# Patient Record
Sex: Female | Born: 1995 | Race: White | Hispanic: No | Marital: Married | Smoking: Never smoker
Health system: Southern US, Community
[De-identification: ages and names within clinical notes are randomized; demographics above are authoritative.]

## PROBLEM LIST (undated history)

## (undated) DIAGNOSIS — N390 Urinary tract infection, site not specified: Secondary | ICD-10-CM

## (undated) DIAGNOSIS — Z309 Encounter for contraceptive management, unspecified: Secondary | ICD-10-CM

## (undated) DIAGNOSIS — Z8719 Personal history of other diseases of the digestive system: Secondary | ICD-10-CM

## (undated) DIAGNOSIS — L709 Acne, unspecified: Secondary | ICD-10-CM

## (undated) DIAGNOSIS — G43909 Migraine, unspecified, not intractable, without status migrainosus: Secondary | ICD-10-CM

## (undated) HISTORY — DX: Encounter for contraceptive management, unspecified: Z30.9

## (undated) HISTORY — PX: WISDOM TOOTH EXTRACTION: SHX21

## (undated) HISTORY — DX: Personal history of other diseases of the digestive system: Z87.19

## (undated) HISTORY — DX: Acne, unspecified: L70.9

## (undated) HISTORY — PX: COLONOSCOPY: SHX174

---
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
## 2016-01-26 IMAGING — CR DG NECK SOFT TISSUE
2 series · 2 of 2 positions shown · non-contrast
Comparison: None.

CLINICAL DATA: Sore throat for 2 months. Three rounds of
antibiotics have not significantly helped.

EXAM:
NECK SOFT TISSUES - 1+ VIEW

[view not recorded (1 of 2)]
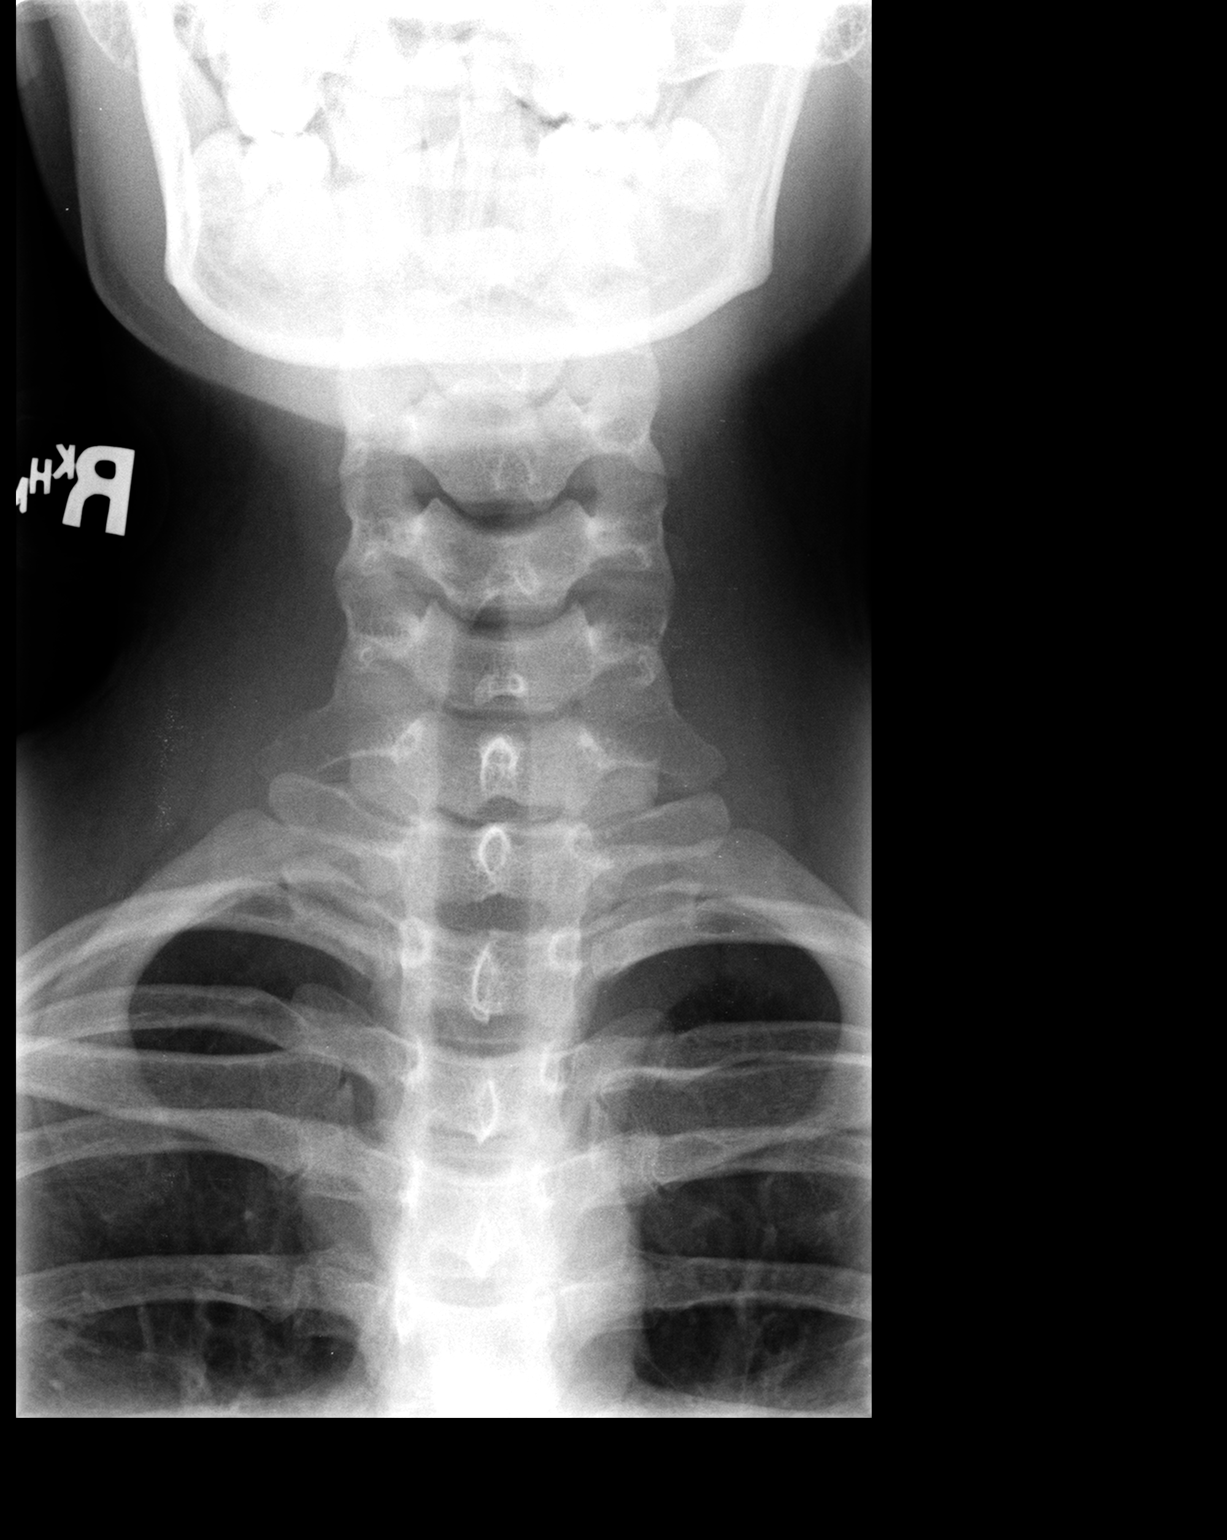

[view not recorded (2 of 2)]
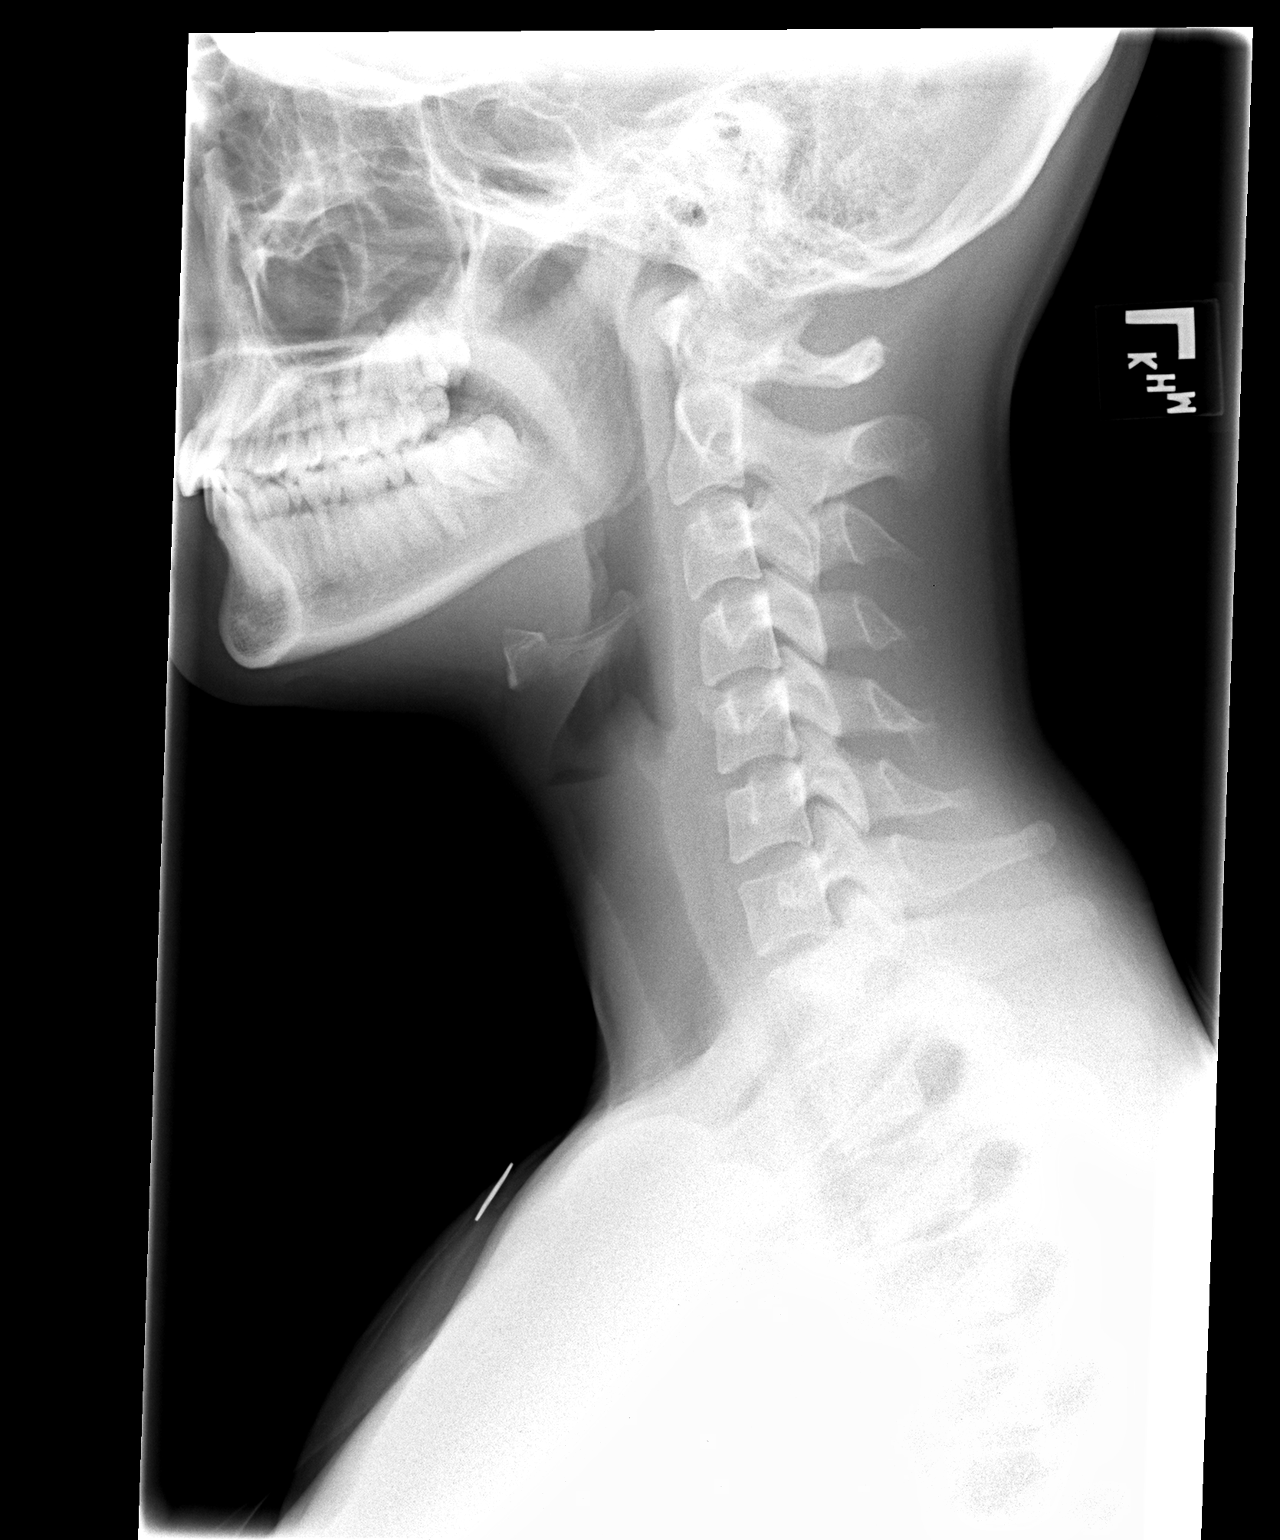

[2 of 2 positions shown; findings below may reference images not displayed]

FINDINGS: There is no evidence of retropharyngeal soft tissue swelling or
epiglottic enlargement. The cervical airway is unremarkable and no
radio-opaque foreign body identified.
IMPRESSION: Negative.

## 2016-03-02 ENCOUNTER — Telehealth: Payer: Self-pay | Admitting: *Deleted

## 2016-03-02 MED ORDER — NORETHIN-ETH ESTRAD-FE BIPHAS 1 MG-10 MCG / 10 MCG PO TABS: 1.0000 | ORAL_TABLET | Freq: Every day | ORAL | Status: DC

## 2016-03-02 NOTE — Telephone Encounter (Signed)
Pt Mother, Arline AspCindy, requesting samples of Loloestrin Fe. Loloestrin Fe (1 box) left at front desk for pick. Pt does not have insurance at this time and that is why they are requesting samples.

## 2016-03-31 ENCOUNTER — Telehealth: Payer: Self-pay | Admitting: Advanced Practice Midwife

## 2016-03-31 MED ORDER — NORETHIN-ETH ESTRAD-FE BIPHAS 1 MG-10 MCG / 10 MCG PO TABS: 1.0000 | ORAL_TABLET | Freq: Every day | ORAL | Status: DC

## 2016-03-31 NOTE — Telephone Encounter (Signed)
Pt Mother, Arline AspCindy, states pt insurance will not cover Lo loestrin FE, cost $80.00. Is the an alternative that would be compatible and cheaper?   1 box of Lo loestrin samples left at front desk, pt is completely out.

## 2016-04-03 ENCOUNTER — Telehealth: Payer: Self-pay | Admitting: Adult Health

## 2016-04-03 MED ORDER — NORETHINDRONE ACET-ETHINYL EST 1-20 MG-MCG PO TABS: 1.0000 | ORAL_TABLET | Freq: Every day | ORAL | Status: DC

## 2016-04-03 NOTE — Telephone Encounter (Signed)
Will change to junel 1-20 and needs appt

## 2016-04-03 NOTE — Telephone Encounter (Signed)
Spoke with pt and pt's mom. Pt is on Lo Loestrin and feels like it's causing acne. Pt is interested in switching to something that will help with acne. Pt's mom mentioned generic Yaz. Please advise. Thanks!! JSY

## 2016-04-06 ENCOUNTER — Telehealth: Payer: Self-pay | Admitting: *Deleted

## 2016-04-06 NOTE — Telephone Encounter (Signed)
Has acne,sees dermatologist in am, keep taking OCs for 3 months.

## 2016-04-06 NOTE — Telephone Encounter (Signed)
Pt Mother, Arline AspCindy, states pt started taking the Microgestin last night, has several questions. Will pt have a period with Microgestin, did not have period with Loloestrin? Is Microgestin good for acne?

## 2016-07-08 ENCOUNTER — Encounter: Payer: Self-pay | Admitting: Advanced Practice Midwife

## 2016-07-08 ENCOUNTER — Ambulatory Visit (INDEPENDENT_AMBULATORY_CARE_PROVIDER_SITE_OTHER): Payer: BLUE CROSS/BLUE SHIELD | Admitting: Advanced Practice Midwife

## 2016-07-08 VITALS — BP 100/60 | HR 78 | Temp 98.2°F | Ht 60.0 in | Wt 102.0 lb

## 2016-07-08 DIAGNOSIS — R309 Painful micturition, unspecified: Secondary | ICD-10-CM | POA: Diagnosis not present

## 2016-07-08 DIAGNOSIS — Z308 Encounter for other contraceptive management: Secondary | ICD-10-CM | POA: Diagnosis not present

## 2016-07-08 DIAGNOSIS — Z3009 Encounter for other general counseling and advice on contraception: Secondary | ICD-10-CM

## 2016-07-08 LAB — POCT URINALYSIS DIPSTICK
Blood, UA: NEGATIVE
Glucose, UA: NEGATIVE
Ketones, UA: NEGATIVE
LEUKOCYTES UA: NEGATIVE
Nitrite, UA: NEGATIVE

## 2016-07-08 MED ORDER — PHENAZOPYRIDINE HCL 200 MG PO TABS: 200.0000 mg | ORAL_TABLET | Freq: Three times a day (TID) | ORAL | 0 refills | Status: DC | PRN

## 2016-07-08 NOTE — Progress Notes (Signed)
[facility] Visit  Patient name: SHAKEIA KRUS MRN [id number]  Date of birth: 1996-10-16  CC & HPI:  Anna Kent is a 20 y.o. Caucasian female presenting today for UTI Symptoms started 6 days ago (urgency, dysuria).  Started Bactrim/Azo (was rx'd it for acne). And took for a few days.  Went to [facility] Urgent care yesterday.  Says urine dip showed "horrible infection" and was given Macrobid.  Culture is pending.  Symptoms not improved.    Also wants to discuss Nexplanon.  Is currently on LoLoestrin;  Got acne after starting it.  Plans Accutane.  Discussed that Nexplanon is unlikely to help with acne, could even make it worse, but hopefully accutane will negate that.  Discussed risks/benefits/SE.     Pertinent History Reviewed:  Medical & Surgical Hx:   Past Medical History:  Diagnosis Date  . Acne 09/06/2014  . Contraceptive management 08/07/2015  . Migraines   . UTI (lower urinary tract infection)    Past Surgical History:  Procedure Laterality Date  . WISDOM TOOTH EXTRACTION     Family History  Problem Relation Age of Onset  . Cancer Mother     breast  . COPD Paternal Grandmother     Current Outpatient Prescriptions:  .  ibuprofen (ADVIL,MOTRIN) 200 MG tablet, Take 400 mg by mouth as needed. For pain, Disp: , Rfl:  .  norethindrone-ethinyl estradiol (MICROGESTIN,JUNEL,LOESTRIN) 1-20 MG-MCG tablet, Take 1 tablet by mouth daily., Disp: 1 Package, Rfl: 3 .  albuterol (PROVENTIL, VENTOLIN) (5 MG/ML) 0.5% NEBU, Take 5 mg/hr by nebulization as needed. , Disp: , Rfl:  .  Biotin (BIOTIN 5000) 5 MG CAPS, Take by mouth as needed. , Disp: , Rfl:  .  cetirizine (ZYRTEC) 10 MG tablet, Take 10 mg by mouth as needed. , Disp: , Rfl:  .  phenazopyridine (PYRIDIUM) 200 MG tablet, Take 1 tablet (200 mg total) by mouth 3 (three) times daily as needed for pain., Disp: 10 tablet, Rfl: 0 Social History: Reviewed -  reports that she has never smoked. She has never used  smokeless tobacco.  Review of Systems:   Constitutional: Negative for FEVER and chills Eyes: Negative for visual disturbances Respiratory: Negative for shortness of breath, dyspnea Cardiovascular: Negative for chest pain or palpitations  Gastrointestinal: Negative for vomiting, diarrhea and constipation; Genitourinary: Negative vaginal irritation or itching Musculoskeletal: Negative for joint pain, myalgias  Neurological: Negative for dizziness and headaches    Objective Findings:    Physical Examination: General appearance - well appearing, and in no distress Mental status - alert, oriented to person, place, and time Chest:  Normal respiratory effort Heart - normal rate and regular rhythm Abdomen:  Soft, tender in suprapubic area.  Has LBP, but no CVAT.   Musculoskeletal:  Normal range of motion without pain Extremities:  No edema    Results for orders placed or performed in visit on 07/08/16 (from the past 24 hour(s))  POCT urinalysis dipstick   Collection Time: 07/08/16  4:34 PM  Result Value Ref Range   Color, UA     Clarity, UA     Glucose, UA neg    Bilirubin, UA     Ketones, UA neg    Spec Grav, UA     Blood, UA neg    pH, UA     Protein, UA trace    Urobilinogen, UA     Nitrite, UA neg    Leukocytes, UA Negative Negative  Assessment & Plan:  A:   UTI:  Has only been on ABX for 1 day!  Will F/U with [facility] urgent care when culture comes through.  Rx Pyridium for pain   Contraception manaagement P:     Return in about 3 weeks (around 07/29/2016) for Nexplanon (order).  CRESENZO-DISHMAN,[place] CNM 07/09/2016 8:56 AM

## 2016-07-10 ENCOUNTER — Ambulatory Visit: Payer: No Typology Code available for payment source | Admitting: Adult Health

## 2016-07-14 ENCOUNTER — Ambulatory Visit: Payer: No Typology Code available for payment source | Admitting: Advanced Practice Midwife

## 2016-07-28 ENCOUNTER — Encounter: Payer: Self-pay | Admitting: Women's Health

## 2016-07-28 ENCOUNTER — Ambulatory Visit (INDEPENDENT_AMBULATORY_CARE_PROVIDER_SITE_OTHER): Payer: BLUE CROSS/BLUE SHIELD | Admitting: Women's Health

## 2016-07-28 VITALS — BP 104/68

## 2016-07-28 DIAGNOSIS — Z3049 Encounter for surveillance of other contraceptives: Secondary | ICD-10-CM | POA: Diagnosis not present

## 2016-07-28 DIAGNOSIS — Z3202 Encounter for pregnancy test, result negative: Secondary | ICD-10-CM | POA: Diagnosis not present

## 2016-07-28 DIAGNOSIS — Z308 Encounter for other contraceptive management: Secondary | ICD-10-CM

## 2016-07-28 DIAGNOSIS — Z30017 Encounter for initial prescription of implantable subdermal contraceptive: Secondary | ICD-10-CM | POA: Insufficient documentation

## 2016-07-28 LAB — POCT URINE PREGNANCY: Preg Test, Ur: NEGATIVE

## 2016-07-28 NOTE — Progress Notes (Signed)
[name] is a 20 y.o. year old Caucasian female here for Nexplanon insertion.  Patient's last menstrual period was 07/20/2016., last sexual intercourse was ~2wks ago, had period last week and no sex since, and her pregnancy test today was negative.  Risks/benefits/side effects of Nexplanon have been discussed and her questions have been answered.  Specifically, a failure rate of 10/998 has been reported, with an increased failure rate if pt takes St. John's Wort and/or antiseizure medicaitons.  Randon GoldsmithRebecca L Kent is aware of the common side effect of irregular bleeding, which the incidence of decreases over time.  BP 104/68 (BP Location: Right Arm, Patient Position: Sitting, Cuff Size: Normal)   LMP 07/20/2016   Results for orders placed or performed in visit on 07/28/16 (from the past 24 hour(s))  POCT urine pregnancy   Collection Time: 07/28/16 10:37 AM  Result Value Ref Range   Preg Test, Ur Negative Negative     She is right-handed, so her left arm, approximately 4 inches proximal from the elbow, was cleansed with alcohol and anesthetized with 2cc of 2% Lidocaine.  The area was cleansed again with betadine and the Nexplanon was inserted per manufacturer's recommendations without difficulty.  A steri-strip and pressure bandage were applied.  Pt was instructed to keep the area clean and dry, remove pressure bandage in 24 hours, and keep insertion site covered with the steri-strip for 3-5 days.  Back up contraception was recommended for 2 weeks.  She was given a card indicating date Nexplanon was inserted and date it needs to be removed. Follow-up PRN problems. Condoms always for STI prevention. Pap @ 20yo.   Marge DuncansBooker, Kimberly Randall CNM, [facility]NP-BC 07/28/2016 11:10 AM

## 2016-07-28 NOTE — Patient Instructions (Signed)

## 2016-12-11 ENCOUNTER — Telehealth: Payer: Self-pay | Admitting: Adult Health

## 2016-12-11 NOTE — Telephone Encounter (Signed)
Spoke with pt's mom. Pt is having pain with urination and also pain when she's not urinating. Crying with pain. PCP can't see her today. Pt needs to be seen and we don't have any available appts. I advised to go to Urgent Care or ER for eval. Pt's mom voiced understanding. JSY

## 2017-09-24 ENCOUNTER — Ambulatory Visit: Payer: BLUE CROSS/BLUE SHIELD | Admitting: Women's Health

## 2017-12-20 ENCOUNTER — Telehealth: Payer: Self-pay | Admitting: *Deleted

## 2017-12-20 NOTE — Telephone Encounter (Signed)
Patient's mother states they want to have the Nexplanon removed since the patient has gained weight and is having some issues with her labs. She was informed by her PCP that the Nexplanon was the reason for her elevated labs. Informed patient that per Selena BattenKim, the patient had elevated platelets 8 years ago before Nexplanon was inserted. Mother still adamant patient have it removed. Will get scheduled. Also wants to discuss other options.

## 2017-12-21 ENCOUNTER — Encounter: Payer: Self-pay | Admitting: Adult Health

## 2017-12-21 ENCOUNTER — Ambulatory Visit: Payer: BLUE CROSS/BLUE SHIELD | Admitting: Adult Health

## 2017-12-21 VITALS — BP 100/60 | HR 97 | Ht 61.0 in | Wt 124.0 lb

## 2017-12-21 DIAGNOSIS — Z3046 Encounter for surveillance of implantable subdermal contraceptive: Secondary | ICD-10-CM

## 2017-12-21 DIAGNOSIS — Z3049 Encounter for surveillance of other contraceptives: Secondary | ICD-10-CM

## 2017-12-21 NOTE — Progress Notes (Signed)
Subjective:     Patient ID: Anna Kent, female   DOB: 1996/07/31, 21 y.o.   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   Review of Systems For nexplanon removal Reviewed past medical,surgical, social and family history. Reviewed medications and allergies.     Objective:   Physical Exam BP 100/60 (BP Location: Left Arm, Patient Position: Sitting, Cuff Size: Small)   Pulse 97   Ht 5\' 1"  (1.549 m)   Wt 124 lb (56.2 kg)   LMP 11/27/2017   BMI 23.43 kg/m  Consent signed, time out called. Left arm cleansed with betadine, and injected with 1.5 cc 1% lidocaine and waited til numb.Under sterile technique a #11 blade was used to make small vertical incision, and a curved forceps was used to easily remove rod. Steri strips applied. Pressure dressing applied.    Assessment:     1. Encounter for Nexplanon removal       Plan:    Review handouts on para guard and liletta  Use condoms, keep clean and dry x 24 hours, no heavy lifting, keep steri strips on x 72 hours, Keep pressure dressing on x 24 hours. Follow up prn problems. NO sex for 2 weeks prior to IUD, needs Cytotec before and stat QHCG in am, when makes that appt. Check insurance for IUDs

## 2017-12-21 NOTE — Patient Instructions (Addendum)
Use condoms, keep clean and dry x 24 hours, no heavy lifting, keep steri strips on x 72 hours, Keep pressure dressing on x 24 hours. Follow up prn problems. NO sex for 2 weeks prior to IUD, [facility] Check insurance lilett

## 2017-12-24 ENCOUNTER — Telehealth: Payer: Self-pay | Admitting: Obstetrics & Gynecology

## 2017-12-24 NOTE — Telephone Encounter (Signed)
Pts mother called wanting a prescription for birth control pills instead of IUD. Informed her that she would have to have an appt for that as we do pregnancy tests before giving prescriptions. She verbalized understanding.

## 2017-12-30 ENCOUNTER — Other Ambulatory Visit: Payer: Self-pay | Admitting: Women's Health

## 2018-01-03 ENCOUNTER — Other Ambulatory Visit: Payer: Self-pay | Admitting: Women's Health

## 2018-01-05 ENCOUNTER — Ambulatory Visit (INDEPENDENT_AMBULATORY_CARE_PROVIDER_SITE_OTHER): Payer: BLUE CROSS/BLUE SHIELD | Admitting: Advanced Practice Midwife

## 2018-01-05 ENCOUNTER — Encounter: Payer: Self-pay | Admitting: Advanced Practice Midwife

## 2018-01-05 VITALS — BP 100/60 | HR 75 | Ht 61.0 in | Wt 125.5 lb

## 2018-01-05 DIAGNOSIS — Z3202 Encounter for pregnancy test, result negative: Secondary | ICD-10-CM | POA: Diagnosis not present

## 2018-01-05 DIAGNOSIS — Z01419 Encounter for gynecological examination (general) (routine) without abnormal findings: Secondary | ICD-10-CM | POA: Diagnosis not present

## 2018-01-05 DIAGNOSIS — Z0000 Encounter for general adult medical examination without abnormal findings: Secondary | ICD-10-CM

## 2018-01-05 DIAGNOSIS — Z113 Encounter for screening for infections with a predominantly sexual mode of transmission: Secondary | ICD-10-CM

## 2018-01-05 DIAGNOSIS — Z30011 Encounter for initial prescription of contraceptive pills: Secondary | ICD-10-CM

## 2018-01-05 LAB — POCT URINE PREGNANCY: Preg Test, Ur: NEGATIVE

## 2018-01-05 MED ORDER — NORGESTIMATE-ETH ESTRADIOL 0.25-35 MG-MCG PO TABS: 1.0000 | ORAL_TABLET | Freq: Every day | ORAL | 11 refills | Status: DC

## 2018-01-05 NOTE — Progress Notes (Addendum)
Anna Kent 22 y.o.  Vitals:   01/05/18 1428  BP: 100/60  Pulse: 75     Filed Weights   01/05/18 1428  Weight: 125 lb 8 oz (56.9 kg)    Past Medical History: Past Medical History:  Diagnosis Date  . Acne 09/06/2014  . Contraceptive management 08/07/2015  . Migraines   . UTI (lower urinary tract infection)     Past Surgical History: Past Surgical History:  Procedure Laterality Date  . WISDOM TOOTH EXTRACTION      Family History: Family History  Problem Relation Age of Onset  . Cancer Mother        breast  . COPD Paternal Grandmother     Social History: Social History   Tobacco Use  . Smoking status: Never Smoker  . Smokeless tobacco: Never Used  Substance Use Topics  . Alcohol use: No    Alcohol/week: 0.0 oz  . Drug use: No    Allergies:  Allergies  Allergen Reactions  . Imitrex [Sumatriptan] Other (See Comments)    Chest pain      Current Outpatient Medications:  .  FLUoxetine (PROZAC) 10 MG capsule, Take 10 mg by mouth daily., Disp: , Rfl:  .  fluticasone (FLONASE) 50 MCG/ACT nasal spray, Place into both nostrils daily., Disp: , Rfl:  .  ibuprofen (ADVIL,MOTRIN) 200 MG tablet, Take 400 mg by mouth as needed. For pain, Disp: , Rfl:  .  loratadine (CLARITIN) 10 MG tablet, Take 10 mg by mouth daily., Disp: , Rfl:  .  norgestimate-ethinyl estradiol (ORTHO-CYCLEN,SPRINTEC,PREVIFEM) 0.25-35 MG-MCG tablet, Take 1 tablet by mouth daily., Disp: 1 Package, Rfl: 11  History of Present Illness: here for birth control pills.  She had nexplanon removed a few weeks ago(weight gain) and had planned on paragard but changed mind.  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    Review of Systems   Patient denies any headaches, blurred vision, shortness of breath, chest pain, abdominal pain, problems with bowel movements, urination, or intercourse.   Physical Exam: General:  Well developed, well  nourished, no acute distress Skin:  Warm and dry Neck:  Midline trachea, normal thyroid Lungs; Clear to auscultation bilaterally Breast:  No dominant palpable mass, retraction, or nipple discharge Cardiovascular: Regular rate and rhythm Abdomen:  Soft, non tender, no hepatosplenomegaly Pelvic:  Deferred Extremities:  No swelling or varicosities noted Psych:  No mood changes.     Impression: normal gyn exam' Contraception mgt     Plan: start sprintec now.  COntinue condoms for 2 weeks

## 2018-01-05 NOTE — Patient Instructions (Signed)
Oral Contraception Information Oral contraceptive pills (OCPs) are medicines taken to prevent pregnancy. OCPs work by preventing the ovaries from releasing eggs. The hormones in OCPs also cause the cervical mucus to thicken, preventing the sperm from entering the uterus. The hormones also cause the uterine lining to become thin, not allowing a fertilized egg to attach to the inside of the uterus. OCPs are highly effective when taken exactly as prescribed. However, OCPs do not prevent sexually transmitted diseases (STDs). Safe sex practices, such as using condoms along with the pill, can help prevent STDs. Before taking the pill, you may have a physical exam and Pap test. Your health care provider may order blood tests. The health care provider will make sure you are a good candidate for oral contraception. Discuss with your health care provider the possible side effects of the OCP you may be prescribed. When starting an OCP, it can take 2 to 3 months for the body to adjust to the changes in hormone levels in your body. Types of oral contraception  The combination pill-This pill contains estrogen and progestin (synthetic progesterone) hormones. The combination pill comes in 21-day, 28-day, or 91-day packs. Some types of combination pills are meant to be taken continuously (365-day pills). With 21-day packs, you do not take pills for 7 days after the last pill. With 28-day packs, the pill is taken every day. The last 7 pills are without hormones. Certain types of pills have more than 21 hormone-containing pills. With 91-day packs, the first 84 pills contain both hormones, and the last 7 pills contain no hormones or contain estrogen only.  The minipill-This pill contains the progesterone hormone only. The pill is taken every day continuously. It is very important to take the pill at the same time each day. The minipill comes in packs of 28 pills. All 28 pills contain the hormone. Advantages of oral  contraceptive pills  Decreases premenstrual symptoms.  Treats menstrual period cramps.  Regulates the menstrual cycle.  Decreases a heavy menstrual flow.  May treatacne, depending on the type of pill.  Treats abnormal uterine bleeding.  Treats polycystic ovarian syndrome.  Treats endometriosis.  Can be used as emergency contraception. Things that can make oral contraceptive pills less effective OCPs can be less effective if:  You forget to take the pill at the same time every day.  You have a stomach or intestinal disease that lessens the absorption of the pill.  You take OCPs with other medicines that make OCPs less effective, such as antibiotics, certain HIV medicines, and some seizure medicines.  You take expired OCPs.  You forget to restart the pill on day 7, when using the packs of 21 pills.  Risks associated with oral contraceptive pills Oral contraceptive pills can sometimes cause side effects, such as:  Headache.  Nausea.  Breast tenderness.  Irregular bleeding or spotting.  Combination pills are also associated with a small increased risk of:  Blood clots.  Heart attack.  Stroke.  This information is not intended to replace advice given to you by your health care provider. Make sure you discuss any questions you have with your health care provider. Document Released: 01/02/2003 Document Revised: 03/19/2016 Document Reviewed: 04/02/2013 Elsevier Interactive Patient Education  2018 Elsevier Inc.  

## 2018-01-07 LAB — GC/CHLAMYDIA PROBE AMP
CHLAMYDIA, DNA PROBE: NEGATIVE
NEISSERIA GONORRHOEAE BY PCR: NEGATIVE

## 2018-04-01 ENCOUNTER — Telehealth: Payer: Self-pay | Admitting: Gastroenterology

## 2018-04-01 NOTE — Telephone Encounter (Signed)
LMOM both numbers for a return call.  

## 2018-04-01 NOTE — Telephone Encounter (Signed)
PLEASE CALL PT. HER MOTHER SAID SHE NEEDED AN APPT WITH A GI DOCTOR. WE CAN SEE HER FOR HER UPPER ABDOMINAL PAIN & TO CONSIDER AN EGD.   CONTINUE PROTONIX. AVOID IBUPROFEN FOR 7 DAYS.

## 2018-04-04 ENCOUNTER — Encounter: Payer: Self-pay | Admitting: Gastroenterology

## 2018-04-04 NOTE — Telephone Encounter (Signed)
PATIENT SCHEDULED AND LETTER SENT  °

## 2018-04-04 NOTE — Telephone Encounter (Signed)
Pt's mom, Arline AspCindy, is aware. Forwarding to Bethany BeachStacey to schedule appt.

## 2018-07-06 ENCOUNTER — Other Ambulatory Visit: Payer: Self-pay | Admitting: *Deleted

## 2018-07-06 ENCOUNTER — Encounter: Payer: Self-pay | Admitting: Gastroenterology

## 2018-07-06 ENCOUNTER — Ambulatory Visit: Payer: BLUE CROSS/BLUE SHIELD | Admitting: Gastroenterology

## 2018-07-06 DIAGNOSIS — R0789 Other chest pain: Secondary | ICD-10-CM

## 2018-07-06 MED ORDER — LIDOCAINE VISCOUS HCL 2 % MT SOLN: OROMUCOSAL | 5 refills | Status: DC

## 2018-07-06 NOTE — Patient Instructions (Signed)
USE VISCOUS LIDOCAINE 2 TSP EVERY 4 HOURS WHEN NEEDED FOR FLARES OF HEARTBURN, CHEST PAIN, OR UPPER ABDOMINAL PAIN. USE A SYRINGE TO INJECT INTO THE BACK OF YOUR THROAT. USE NO MORE THAN 8 DOSES A DAY. IT WILL MAKE YOUR MOUTH, ESOPHAGUS, AND STOMACH NUMB.  COMPLETE ESOPHAGEAL MANOMETRY/IMPEDANCE/Ph STUDY. PLEASE CALL WHEN STUDY IS COMPETE.   FOLLOW UP IN 3 MOS.

## 2018-07-06 NOTE — Assessment & Plan Note (Signed)
SYMPTOMS NOT IDEALLY CONTROLLED. Marland Kitchen DIFFERENTIAL DIAGNOSIS INCLUDES: NUTCRACKER ESOPHAGUS, ACHALASIA, DES, LESS LI KLEY NONULCER DYSPEPSIA.   USE VISCOUS LIDOCAINE 2 TSP EVERY 4 HOURS WHEN NEEDED FOR FLARES OF HEARTBURN, CHEST PAIN, OR UPPER ABDOMINAL PAIN. USE A SYRINGE TO INJECT INTO THE BACK OF YOUR THROAT. USE NO MORE THAN 8 DOSES A DAY. IT WILL MAKE YOUR MOUTH, ESOPHAGUS, AND STOMACH NUMB. COMPLETE ESOPHAGEAL MANOMETRY/IMPMEDANCE/Ph STUDY. CALL WHEN STUDY IS COMPETE. FOLLOW UP IN 3 MOS.

## 2018-07-06 NOTE — Progress Notes (Signed)
Subjective:    Patient ID: Anna Kent, female    DOB: December 08, 1995, 22 y.o.   MRN: [id number]  Anna Epley, PA-C  HPI Always alleviated with PEPTO BISMOL 2XMAX RELIEVES IT AND IT GOES AWAY FOR 10 MINS AND IF NOT SHE TAKES ANOTHER DOSE. . EOS HIGH AND HAD EGD. AMY BE AS BAD AS 7-10/10. NO ULCERS. ABDOMINAL PAIN: 3 YRS AGO SAME TO WORSE. TRIGGERS: NONE. TRY TO IGNORE IT AT FIRST THEN WHEN GETTING WORSE. THINKING VISCOUS LIDOCAINE MAY WORK. HAS HAD VOMITED WHEN IT HAPPENS SOMETIMES BUT NAUSEA ALL THE TIME. EPISODE HAPPEN 3-4 TIMES A WEEK AND TWICE A DAY OR ONE OR 2 MOS. HAD LAST ONE Sunday SITTING IN [place] AND AFTER PEPTO 1 DOSE IT WENT AWAY. APPETITE: NL. WEIGHT: GAINED (NL 100-105 LBS) NOW 127 LBS.DOES CROSS FIT. PPIs HAVE NEVER MADE ANY DIFFERENCE. NO ASPIRIN, BC/GOODY POWDERS, OR NAPROXEN/ALEVE. RARE  IBUPROFEN/MOTRIN AND TYLENOL.  NO ETOH. CT 2015: NAIAP. LMP: AUG 17-CYCLES EVERY MO, NO HEAVY BLEEDING, NOT A LOT OF PAIN SINCE BEING ON OCP.  PT DENIES FEVER, CHILLS, HEMATOCHEZIA, HEMATEMESIS, melena, diarrhea, CHEST PAIN, SHORTNESS OF BREATH, CHANGE IN BOWEL IN HABITS, constipation, abdominal pain, problems swallowing, problems with sedation, OR heartburn or indigestion.  Past Medical History:  Diagnosis Date  . Acne 09/06/2014  . Contraceptive management 08/07/2015  . Migraines   . UTI (lower urinary tract infection)    Past Surgical History:  Procedure Laterality Date  . WISDOM TOOTH EXTRACTION     Allergies  Allergen Reactions  . Imitrex [Sumatriptan] Other (See Comments)    Chest pain    Current Outpatient Medications  Medication Sig    . FLUoxetine (PROZAC) 10 MG capsule Take 10 mg by mouth daily.    . fluticasone (FLONASE) 50 MCG/ACT nasal spray Place into both nostrils daily.    Marland Kitchen ibuprofen (ADVIL,MOTRIN) 200 MG tablet Take 400 mg by mouth as needed. For pain    . loratadine (CLARITIN) 10 MG tablet Take 10 mg by mouth daily.    . norgestimate-ethinyl  estradiol (ORTHO-CYCLEN,SPRINTEC,PREVIFEM) 0.25-35 MG-MCG tablet Take 1 tablet by mouth daily.     Family History  Problem Relation Age of Onset  . Cancer Mother        breast  . COPD Paternal Grandmother    Social History   Socioeconomic History  . Marital status: Single    Spouse name: Not on file  . Number of children: Not on file  . Years of education: Not on file  . Highest education level: Not on file  Occupational History  . Not on file  Social Needs  . Financial resource strain: Not on file  . Food insecurity:    Worry: Not on file    Inability: Not on file  . Transportation needs:    Medical: Not on file    Non-medical: Not on file  Tobacco Use  . Smoking status: Never Smoker  . Smokeless tobacco: Never Used  Substance and Sexual Activity  . Alcohol use: No    Alcohol/week: 0.0 standard drinks  . Drug use: No  . Sexual activity: Yes    Birth control/protection: Condom, None  Lifestyle  . Physical activity:    Days per week: Not on file    Minutes per session: Not on file  . Stress: Not on file  Relationships  . Social connections:    Talks on phone: Not on file    Gets together: Not on file  Attends religious service: Not on file    Active member of club or organization: Not on file    Attends meetings of clubs or organizations: Not on file    Relationship status: Not on file  Other Topics Concern  . 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 HAS A FIANCEE.     Review of Systems PER HPI OTHERWISE ALL SYSTEMS ARE NEGATIVE.     Objective:   Physical Exam  Constitutional: She is oriented to person, place, and time. She appears well-developed and well-nourished. No distress.  HENT:  Head: Normocephalic and atraumatic.  Mouth/Throat: Oropharynx is clear and moist. No oropharyngeal exudate.  Eyes: Pupils are equal, round, and reactive to light. No scleral icterus.  Neck: Normal  range of motion. Neck supple.  Cardiovascular: Normal rate, regular rhythm and normal heart sounds.  Pulmonary/Chest: Effort normal and breath sounds normal. No respiratory distress.  Abdominal: Soft. Bowel sounds are normal. She exhibits no distension. There is no tenderness.  Musculoskeletal: She exhibits no edema.  Lymphadenopathy:    She has no cervical adenopathy.  Neurological: She is alert and oriented to person, place, and time.  NO FOCAL DEFICITS  Psychiatric: She has a normal mood and affect.  Vitals reviewed.     Assessment & Plan:

## 2018-07-07 ENCOUNTER — Telehealth: Payer: Self-pay

## 2018-07-07 ENCOUNTER — Other Ambulatory Visit: Payer: Self-pay

## 2018-07-07 DIAGNOSIS — R0789 Other chest pain: Secondary | ICD-10-CM

## 2018-07-07 NOTE — Progress Notes (Signed)
cc'ed to pcp °

## 2018-07-07 NOTE — Telephone Encounter (Signed)
Called the preferred number. No answer. Left message to call back.

## 2018-07-07 NOTE — Progress Notes (Signed)
ON RECALL  °

## 2018-07-07 NOTE — Telephone Encounter (Signed)
Spoke with the patient. Sched[facility] 07/13/18 at 12:30 pm. Arrive between 12:oo and 12:15. Fast for 4 hours prior.

## 2018-07-12 ENCOUNTER — Encounter (HOSPITAL_COMMUNITY): Payer: Self-pay | Admitting: *Deleted

## 2018-07-12 ENCOUNTER — Telehealth: Payer: Self-pay | Admitting: Gastroenterology

## 2018-07-12 NOTE — Progress Notes (Signed)
Called patient, but only left voicemail.

## 2018-07-12 NOTE — Progress Notes (Signed)
Spoke with patient fore pre-procedure interview via phone. No meds AM of surgery. Per pt she was told by MD office she did not have to arrive until 1200. Instructed to go to admitting upon arrival.

## 2018-07-12 NOTE — Telephone Encounter (Signed)
Called patient back and went over her appointment [facility], patient stated [facility] End[facility] in our [facility] and proceeded to go over EGD instructions..  Verified and confirmed esophageal mano for tomorrow

## 2018-07-13 ENCOUNTER — Encounter (HOSPITAL_COMMUNITY)
Admission: RE | Disposition: A | Discharge status: Home / Self Care | Payer: Self-pay | Source: Ambulatory Visit | Attending: Gastroenterology

## 2018-07-13 ENCOUNTER — Ambulatory Visit (HOSPITAL_COMMUNITY)
Admission: RE | Admit: 2018-07-13 | Discharge: 2018-07-13 | Disposition: A | Discharge status: Home / Self Care | Payer: BLUE CROSS/BLUE SHIELD | Source: Ambulatory Visit | Attending: Gastroenterology | Admitting: Gastroenterology

## 2018-07-13 DIAGNOSIS — R0789 Other chest pain: Secondary | ICD-10-CM | POA: Insufficient documentation

## 2018-07-13 DIAGNOSIS — Z538 Procedure and treatment not carried out for other reasons: Secondary | ICD-10-CM | POA: Diagnosis not present

## 2018-07-13 SURGERY — Surgical Case

## 2018-07-13 MED ORDER — LIDOCAINE VISCOUS HCL 2 % MT SOLN
OROMUCOSAL | Status: AC
  Filled 2018-07-13: qty 15

## 2018-07-13 SURGICAL SUPPLY — 2 items
FACESHIELD LNG OPTICON STERILE (SAFETY) IMPLANT
GLOVE BIO SURGEON STRL SZ8 (GLOVE) ×6 IMPLANT

## 2018-07-13 NOTE — Progress Notes (Signed)
Attempted to place probe and perform study, but patient unable to tolerate procedure.  Patient vomited multiple times and asked that probe be removed.  When probe was removed, noted scant amount of blood from nose on probe.  Patient comforted and mother called to bedside.  Encouraged patient to reach out to Dr. Darrick PennaFields to re-evaluate other options.

## 2018-08-23 ENCOUNTER — Encounter: Payer: Self-pay | Admitting: Gastroenterology

## 2018-11-07 ENCOUNTER — Telehealth: Payer: Self-pay | Admitting: Adult Health

## 2018-11-07 MED ORDER — NORGESTIMATE-ETH ESTRADIOL 0.25-35 MG-MCG PO TABS: 1.0000 | ORAL_TABLET | Freq: Every day | ORAL | 11 refills | Status: DC

## 2018-11-07 NOTE — Telephone Encounter (Signed)
Refilled OCs 

## 2018-11-07 NOTE — Telephone Encounter (Signed)
Spoke with pt's mom. Pt is requesting a 90 day supply on her birth control. I advised pt's mom that she needs to schedule a pap since she is 22 now. Pt's mom voiced understanding. JSY

## 2018-11-07 NOTE — Telephone Encounter (Signed)
Patient's mom Arline Asp called and is requesting 90 refill for her bc.  She stated that he needs to be called in today because she was supposed to start it yesterday.  [facility]  [phone]

## 2018-11-07 NOTE — Addendum Note (Signed)
Addended by: Cyril MourningGRIFFIN, JENNIFER A on: 11/07/2018 01:30 PM   Modules accepted: Orders

## 2018-11-24 ENCOUNTER — Telehealth: Payer: Self-pay | Admitting: Adult Health

## 2018-11-24 MED ORDER — NORGESTIMATE-ETH ESTRADIOL 0.25-35 MG-MCG PO TABS: 1.0000 | ORAL_TABLET | Freq: Every day | ORAL | 3 refills | Status: DC

## 2018-11-24 NOTE — Telephone Encounter (Signed)
Patient called, scheduled a P&P for 01/10/2019 with Victorino Dike.  She is requesting a refill on her bc.  Lu[facility] Ochoa [place], [place]  [phone]

## 2018-11-24 NOTE — Telephone Encounter (Signed)
Pt requests refill on birth control. Has appt for pap/physical in March. Advised that I would send her request to a provider and she could check with her pharmacy in the next hours. Pt verbalized understanding.

## 2018-11-24 NOTE — Telephone Encounter (Signed)
Refilled sprintec til appt

## 2018-12-02 ENCOUNTER — Other Ambulatory Visit: Payer: Self-pay | Admitting: Advanced Practice Midwife

## 2019-01-10 ENCOUNTER — Other Ambulatory Visit (HOSPITAL_COMMUNITY)
Admission: RE | Admit: 2019-01-10 | Discharge: 2019-01-10 | Disposition: A | Discharge status: Home / Self Care | Payer: BLUE CROSS/BLUE SHIELD | Source: Ambulatory Visit | Attending: Adult Health | Admitting: Adult Health

## 2019-01-10 ENCOUNTER — Encounter: Payer: Self-pay | Admitting: Adult Health

## 2019-01-10 ENCOUNTER — Other Ambulatory Visit: Payer: Self-pay

## 2019-01-10 ENCOUNTER — Ambulatory Visit (INDEPENDENT_AMBULATORY_CARE_PROVIDER_SITE_OTHER): Payer: BLUE CROSS/BLUE SHIELD | Admitting: Adult Health

## 2019-01-10 VITALS — BP 112/66 | HR 84 | Ht 61.0 in | Wt 133.0 lb

## 2019-01-10 DIAGNOSIS — Z3041 Encounter for surveillance of contraceptive pills: Secondary | ICD-10-CM | POA: Diagnosis not present

## 2019-01-10 DIAGNOSIS — Z01419 Encounter for gynecological examination (general) (routine) without abnormal findings: Secondary | ICD-10-CM | POA: Insufficient documentation

## 2019-01-10 MED ORDER — NORGESTIMATE-ETH ESTRADIOL 0.25-35 MG-MCG PO TABS: 1.0000 | ORAL_TABLET | Freq: Every day | ORAL | 4 refills | Status: DC

## 2019-01-10 NOTE — Progress Notes (Signed)
Patient ID: [name], female   DOB: [birth year], 23 y.o.   MRN: [id number] History of Present Illness:  Anna Kent is a 23 year old white female, engaged, G0P0 in for a well woman gyn exam and her first pap.She should graduate 02/25/2019 from [employer] with a nursing degree and is getting married 03/18/2019 at [place].  PCP is Terie Purser, [place] at [place].   Current Medications, Allergies, Past Medical History, Past Surgical History, Family History and Social History were reviewed in [employer] record.     Review of Systems: Patient denies any headaches, hearing loss, fatigue, blurred vision, shortness of breath, chest pain, abdominal pain, problems with bowel movements, urination, or intercourse. No joint pain or mood swings. She is happy with [employer].     Physical Exam:BP 112/66 (BP Location: Left Arm, Patient Position: Sitting, Cuff Size: Normal)   Pulse 84   Ht 5\' 1"  (1.549 m)   Wt 133 lb (60.3 kg)   LMP 12/27/2018 (Exact Date)   BMI 25.13 kg/m  General:  Well developed, well nourished, no acute distress Skin:  Warm and dry Neck:  Midline trachea, normal thyroid, good ROM, no lymphadenopathy Lungs; Clear to auscultation bilaterally Breast:  No dominant palpable mass, retraction, or nipple discharge Cardiovascular: Regular rate and rhythm Abdomen:  Soft, non tender, no hepatosplenomegaly Pelvic:  External genitalia is normal in appearance, no lesions.  The vagina is normal in appearance. Urethra has no lesions or masses. The cervix is smooth and nulliparous, pap with reflex HPV performed.  Uterus is felt to be normal size, shape, and contour.  No adnexal masses or tenderness noted.Bladder is non tender, no masses felt. Extremities/musculoskeletal:  No swelling or varicosities noted, no clubbing or cyanosis Psych:  No mood changes, alert and cooperative,seems happy Fall risk is low. PHQ 2 score 0. Examination chaperoned by Malachy Mood LPN. She declines  STD testing.   Impression: 1. Encounter for gynecological examination with Papanicolaou smear of cervix   2. Encounter for surveillance of contraceptive pills       Plan: Meds ordered this encounter  Medications  . norgestimate-ethinyl estradiol (ORTHO-CYCLEN,SPRINTEC,PREVIFEM) 0.25-35 MG-MCG tablet    Sig: Take 1 tablet by mouth daily.    Dispense:  3 Package    Refill:  4    Order Specific Question:   Supervising Provider    Answer:   Lazaro Arms [[id number]]  Physical in 1 year Pap in 3 if normal

## 2019-01-13 LAB — CYTOLOGY - PAP: Diagnosis: NEGATIVE

## 2019-10-27 HISTORY — PX: LIPOSUCTION: SHX10

## 2020-01-22 ENCOUNTER — Other Ambulatory Visit: Payer: Self-pay | Admitting: Adult Health

## 2020-01-26 ENCOUNTER — Other Ambulatory Visit: Payer: Self-pay | Admitting: Adult Health

## 2020-02-20 ENCOUNTER — Telehealth: Payer: Self-pay | Admitting: Adult Health

## 2020-02-20 MED ORDER — NORGESTIMATE-ETH ESTRADIOL 0.25-35 MG-MCG PO TABS: 1.0000 | ORAL_TABLET | Freq: Every day | ORAL | 0 refills | Status: DC

## 2020-02-20 NOTE — Addendum Note (Signed)
Addended by: Cyril Mourning A on: 02/20/2020 04:02 PM   Modules accepted: Orders

## 2020-02-20 NOTE — Telephone Encounter (Signed)
Patient scheduled a pap/physical for 04/05/20 with Victorino Dike.  She is requesting a refill on her bc.  [facility] [address]  [phone]

## 2020-02-20 NOTE — Telephone Encounter (Signed)
Refilled OCs 

## 2020-03-07 ENCOUNTER — Telehealth: Payer: Self-pay | Admitting: Obstetrics and Gynecology

## 2020-03-07 NOTE — Telephone Encounter (Signed)

## 2020-03-08 ENCOUNTER — Encounter: Payer: Self-pay | Admitting: Adult Health

## 2020-03-08 ENCOUNTER — Ambulatory Visit (INDEPENDENT_AMBULATORY_CARE_PROVIDER_SITE_OTHER): Payer: Self-pay | Admitting: Adult Health

## 2020-03-08 ENCOUNTER — Other Ambulatory Visit: Payer: Self-pay

## 2020-03-08 VITALS — BP 110/73 | HR 71 | Ht 60.5 in | Wt 134.5 lb

## 2020-03-08 DIAGNOSIS — Z01419 Encounter for gynecological examination (general) (routine) without abnormal findings: Secondary | ICD-10-CM | POA: Insufficient documentation

## 2020-03-08 DIAGNOSIS — Z3041 Encounter for surveillance of contraceptive pills: Secondary | ICD-10-CM

## 2020-03-08 MED ORDER — NORGESTIMATE-ETH ESTRADIOL 0.25-35 MG-MCG PO TABS: 1.0000 | ORAL_TABLET | Freq: Every day | ORAL | 4 refills | Status: DC

## 2020-03-08 NOTE — Progress Notes (Signed)
Patient ID: Anna Kent, female   DOB: [birth year], 24 y.o.   MRN: [id number] History of Present Illness:  Anna Kent is a 24 year old white female,married, G0P0, in for well woman gyn exam, she had a normal pap 01/10/2019. PCP is Terie Purser PA, and sees NP in [place].   Current Medications, Allergies, Past Medical History, Past Surgical History, Family History and Social History were reviewed in [employer] record.     Review of Systems:  Patient denies any daily headaches, hearing loss, fatigue, blurred vision, shortness of breath, chest pain, abdominal pain, problems with bowel movements, urination, or intercourse. No joint pain or mood swings. Happy with COs   Physical Exam:BP 110/73 (BP Location: Left Arm, Patient Position: Sitting, Cuff Size: Normal)   Pulse 71   Ht 5' 0.5" (1.537 m)   Wt 134 lb 8 oz (61 kg)   LMP 02/21/2020 (Approximate)   BMI 25.84 kg/m  General:  Well developed, well nourished, no acute distress Skin:  Warm and dry Neck:  Midline trachea, normal thyroid, good ROM, no lymphadenopathy Lungs; Clear to auscultation bilaterally Breast:  No dominant palpable mass, retraction, or nipple discharge Cardiovascular: Regular rate and rhythm Abdomen:  Soft, non tender, no hepatosplenomegaly Pelvic:  External genitalia is normal in appearance, no lesions.  The vagina is normal in appearance. Urethra has no lesions or masses. The cervix is smooth and nulliparous.  Uterus is felt to be normal size, shape, and contour.  No adnexal masses or tenderness noted.Bladder is non tender, no masses felt. Extremities/musculoskeletal:  No swelling or varicosities noted, no clubbing or cyanosis Psych:  No mood changes, alert and cooperative,seems happy AA 1 Fall risk is low PHQ 9 scor eis 0 Examination chaperoned by Malachy Mood LPN  Impression and Plan: 1. Encounter for well woman exam with routine gynecological exam Physical in 1 year Pap in 2023 Labs with  PCP  2. Encounter for surveillance of contraceptive pills Will continue OCs Meds ordered this encounter  Medications  . norgestimate-ethinyl estradiol (ESTARYLLA) 0.25-35 MG-MCG tablet    Sig: Take 1 tablet by mouth daily.    Dispense:  84 tablet    Refill:  4    Order Specific Question:   Supervising Provider    Answer:   Duane Lope H [[id number]]

## 2020-04-05 ENCOUNTER — Other Ambulatory Visit: Payer: BLUE CROSS/BLUE SHIELD | Admitting: Adult Health

## 2021-01-16 ENCOUNTER — Telehealth: Payer: Self-pay | Admitting: Adult Health

## 2021-01-16 MED ORDER — FLUCONAZOLE 150 MG PO TABS
ORAL_TABLET | ORAL | 1 refills | Status: DC
Start: 2021-01-16 — End: 2021-02-27

## 2021-01-16 NOTE — Telephone Encounter (Signed)
Pt informed that rx sent in for diflucan

## 2021-01-16 NOTE — Addendum Note (Signed)
Addended by: Cyril Mourning A on: 01/16/2021 05:10 PM   Modules accepted: Orders

## 2021-01-16 NOTE — Telephone Encounter (Signed)
Pateint called stating that she was prescribed Augmentin antibiotic  And it has caused her to have to have a very bad yeast infection and she would like to know if Victorino Dike could call her in something for it. Patient states that she has tried Monistat but it is not working. Please contact pt when sent. Patient states she uses [facility] on [address]

## 2021-02-27 ENCOUNTER — Other Ambulatory Visit: Payer: BC Managed Care – PPO | Admitting: Adult Health

## 2021-02-27 ENCOUNTER — Ambulatory Visit (INDEPENDENT_AMBULATORY_CARE_PROVIDER_SITE_OTHER): Payer: BC Managed Care – PPO | Admitting: Adult Health

## 2021-02-27 ENCOUNTER — Other Ambulatory Visit (HOSPITAL_COMMUNITY)
Admission: RE | Admit: 2021-02-27 | Discharge: 2021-02-27 | Disposition: A | Discharge status: Home / Self Care | Payer: BC Managed Care – PPO | Source: Ambulatory Visit | Attending: Adult Health | Admitting: Adult Health

## 2021-02-27 ENCOUNTER — Encounter: Payer: Self-pay | Admitting: Adult Health

## 2021-02-27 VITALS — BP 126/76 | HR 96 | Ht 60.75 in | Wt 150.0 lb

## 2021-02-27 DIAGNOSIS — Z113 Encounter for screening for infections with a predominantly sexual mode of transmission: Secondary | ICD-10-CM | POA: Insufficient documentation

## 2021-02-27 DIAGNOSIS — Z01419 Encounter for gynecological examination (general) (routine) without abnormal findings: Secondary | ICD-10-CM | POA: Diagnosis not present

## 2021-02-27 DIAGNOSIS — Z3041 Encounter for surveillance of contraceptive pills: Secondary | ICD-10-CM | POA: Diagnosis not present

## 2021-02-27 MED ORDER — NORGESTIMATE-ETH ESTRADIOL 0.25-35 MG-MCG PO TABS: 1.0000 | ORAL_TABLET | Freq: Every day | ORAL | 4 refills | Status: DC

## 2021-02-27 NOTE — Progress Notes (Signed)
Patient ID: Anna Kent, female   DOB: 05-Dec-1995, 25 y.o.   MRN: [id number] History of Present Illness:  Anna Kent is a 25 year old white female, married, G0P0, in for well woman gyn exam, she had normal pap 01/10/2019.   Current Medications, Allergies, Past Medical History, Past Surgical History, Family History and Social History were reviewed in [employer] record.     Review of Systems: Patient denies any headaches, hearing loss, fatigue, blurred vision, shortness of breath, chest pain, abdominal pain, problems with bowel movements, urination, or intercourse. No joint pain or mood swings. She is happy with OCs She has elevated platelets, since 2010, in Epic, seeing PCP in [place] area.    Physical Exam:BP 126/76 (BP Location: Left Arm, Patient Position: Sitting, Cuff Size: Normal)   Pulse 96   Ht 5' 0.75" (1.543 m)   Wt 150 lb (68 kg)   LMP 02/20/2021   BMI 28.58 kg/m  General:  Well developed, well nourished, no acute distress Skin:  Warm and dry Neck:  Midline trachea, normal thyroid, good ROM, no lymphadenopathy Lungs; Clear to auscultation bilaterally Breast:  No dominant palpable mass, retraction, or nipple discharge Cardiovascular: Regular rate and rhythm Abdomen:  Soft, non tender, no hepatosplenomegaly Pelvic:  External genitalia is normal in appearance, no lesions.  The vagina is normal in appearance. Urethra has no lesions or masses. The cervix is nulliparous, CV swab obtained .  Uterus is felt to be normal size, shape, and contour.  No adnexal masses or tenderness noted.Bladder is non tender, no masses felt. Extremities/musculoskeletal:  No swelling or varicosities noted, no clubbing or cyanosis Psych:  No mood changes, alert and cooperative,seems happy AA is 1 Fall risk is low PHQ 9 score is 1 GAD 7 0  Upstream - 02/27/21 1127      Pregnancy Intention Screening   Does the patient want to become pregnant in the next year? No    Does the  patient's partner want to become pregnant in the next year? No    Would the patient like to discuss contraceptive options today? No      Contraception Wrap Up   Current Method Oral Contraceptive    End Method Oral Contraceptive    Contraception Counseling Provided No         Examination chaperoned by Malachy Mood LPN   Impression and Plan: 1. Encounter for well woman exam with routine gynecological exam Pap and physical in 1 year  2. Encounter for surveillance of contraceptive pills Continue OCs  Meds ordered this encounter  Medications  . norgestimate-ethinyl estradiol (ESTARYLLA) 0.25-35 MG-MCG tablet    Sig: Take 1 tablet by mouth daily.    Dispense:  84 tablet    Refill:  4    Order Specific Question:   Supervising Provider    Answer:   Despina Hidden, LUTHER H [[id number]]    3. Screening examination for STD (sexually transmitted disease) CV swab sent for GC/CHL and trich

## 2021-02-28 LAB — CERVICOVAGINAL ANCILLARY ONLY
Chlamydia: NEGATIVE
Comment: NEGATIVE
Comment: NEGATIVE
Comment: NORMAL
Neisseria Gonorrhea: NEGATIVE
Trichomonas: NEGATIVE

## 2021-03-03 ENCOUNTER — Telehealth: Payer: Self-pay

## 2021-03-03 NOTE — Telephone Encounter (Signed)
-----   Message from Adline Potter, NP sent at 03/03/2021  9:42 AM EDT ----- Let pt know all negative

## 2021-03-03 NOTE — Telephone Encounter (Signed)
Called pt to inform her of test results. Sent pt a text code for Mychart. Pt confirmed understanding.

## 2021-03-10 ENCOUNTER — Other Ambulatory Visit: Payer: BC Managed Care – PPO | Admitting: Adult Health

## 2021-03-15 ENCOUNTER — Other Ambulatory Visit: Payer: Self-pay | Admitting: Adult Health

## 2021-05-28 ENCOUNTER — Telehealth: Payer: Self-pay | Admitting: Adult Health

## 2021-05-28 ENCOUNTER — Telehealth: Payer: Self-pay

## 2021-05-28 DIAGNOSIS — E282 Polycystic ovarian syndrome: Secondary | ICD-10-CM

## 2021-05-28 MED ORDER — NORETHINDRONE 0.35 MG PO TABS: 1.0000 | ORAL_TABLET | Freq: Every day | ORAL | 11 refills | Status: DC

## 2021-05-28 NOTE — Addendum Note (Signed)
Addended by: Cyril Mourning A on: 05/28/2021 04:29 PM   Modules accepted: Orders

## 2021-05-28 NOTE — Telephone Encounter (Signed)
Pt had heard prior message will get [place] to assess ovaries

## 2021-05-28 NOTE — Telephone Encounter (Signed)
Returned pt's call for clarification. Pt states that within the last few months, she has been diagnosed with metabolic syndrome, insulin resistance, dumping syndrome, and fatty liver disease. She states that despite a healthy lifestyle and diet, she has gained 60 lbs in the past 4 years. She also has noticed slight hair growth on her chin and her acne is back after being on Accutane for a year prior. She has a hx of migraine with aura and is concerned about oral combo BC med, but has had no migraines since starting it. She is concerned about possible PCOS, but has had no pelvic or ovary pain. Told pt her concerns would be forwarded to [facility] and after review, she would either call or send a Mychart msg. Pt confirmed understanding.

## 2021-05-28 NOTE — Telephone Encounter (Signed)
Left message, I hate you have had problems, so stop COCs and will rx Micronor, which is POP, which is progestin only pill, use condoms for 1 pack at least , and call me back can get [place] scheduled to look at ovaries

## 2021-05-28 NOTE — Telephone Encounter (Signed)
Patient calling stating that some medical stuff has changed with her and she wanted to speak to you about her [facility] meds because of it she has some questions. As for a call back

## 2021-06-04 ENCOUNTER — Ambulatory Visit (INDEPENDENT_AMBULATORY_CARE_PROVIDER_SITE_OTHER): Payer: BC Managed Care – PPO

## 2021-06-04 ENCOUNTER — Other Ambulatory Visit: Payer: Self-pay

## 2021-06-04 DIAGNOSIS — E282 Polycystic ovarian syndrome: Secondary | ICD-10-CM | POA: Diagnosis not present

## 2021-06-04 NOTE — Progress Notes (Signed)
PELVIC US TA/TV: homogeneous anteverted uterus,wnl,EEC 5.9 mm,normal ovaries,ovaries appear mobile,no free fluid  Chaperone Marchelle Folks

## 2021-06-05 ENCOUNTER — Telehealth: Payer: Self-pay | Admitting: Adult Health

## 2021-06-05 NOTE — Telephone Encounter (Signed)
Informed pt US was normal.

## 2021-11-11 ENCOUNTER — Telehealth: Payer: Self-pay

## 2021-11-11 NOTE — Telephone Encounter (Signed)
Pt called about the birth control she is on.  Pt stated that she hadn't had a period in 6 months and yesterday she started her period and wanted to know if this was normal.

## 2021-11-11 NOTE — Telephone Encounter (Signed)
Spoke with patient and advised that sometimes women do have periods with birth control. Pt denies missing any pills or changing the way she takes the pills. Advised that she track her bleeding and let us know if it becomes frequent or problematic. Patient agreeable and has no other questions at this time.

## 2022-02-28 ENCOUNTER — Other Ambulatory Visit: Payer: Self-pay | Admitting: Adult Health

## 2022-03-11 ENCOUNTER — Other Ambulatory Visit: Payer: Self-pay

## 2022-03-11 MED ORDER — NORETHINDRONE 0.35 MG PO TABS: 1.0000 | ORAL_TABLET | Freq: Every day | ORAL | 0 refills | Status: DC

## 2022-03-25 ENCOUNTER — Encounter: Payer: Self-pay | Admitting: Adult Health

## 2022-03-25 ENCOUNTER — Ambulatory Visit (INDEPENDENT_AMBULATORY_CARE_PROVIDER_SITE_OTHER): Payer: BC Managed Care – PPO | Admitting: Adult Health

## 2022-03-25 ENCOUNTER — Other Ambulatory Visit (HOSPITAL_COMMUNITY)
Admission: RE | Admit: 2022-03-25 | Discharge: 2022-03-25 | Disposition: A | Discharge status: Home / Self Care | Payer: BC Managed Care – PPO | Source: Ambulatory Visit | Attending: Adult Health | Admitting: Adult Health

## 2022-03-25 VITALS — BP 114/79 | HR 81 | Ht 60.75 in | Wt 162.0 lb

## 2022-03-25 DIAGNOSIS — Z3041 Encounter for surveillance of contraceptive pills: Secondary | ICD-10-CM | POA: Diagnosis not present

## 2022-03-25 DIAGNOSIS — Z01419 Encounter for gynecological examination (general) (routine) without abnormal findings: Secondary | ICD-10-CM

## 2022-03-25 DIAGNOSIS — N393 Stress incontinence (female) (male): Secondary | ICD-10-CM

## 2022-03-25 MED ORDER — NORETHINDRONE 0.35 MG PO TABS: 1.0000 | ORAL_TABLET | Freq: Every day | ORAL | 4 refills | Status: DC

## 2022-03-25 NOTE — Progress Notes (Signed)
Patient ID: Anna Kent, female   DOB: June 16, 1996, 26 y.o.   MRN: [id number] History of Present Illness: Anna Kent is a 26 year old white female, married, G0P0, in for a well woman gyn exam and pap. 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 She is having some stress urinary incontinence. PCP is Rockne Coons NP.   Current Medications, Allergies, Past Medical History, Past Surgical History, Family History and Social History were reviewed in [employer] record.     Review of Systems:  Patient denies any headaches, hearing loss, fatigue, blurred vision, shortness of breath, chest pain, abdominal pain, problems with bowel movements,  or intercourse. No joint pain or mood swings.  +stress urinary incontinence, with cough or sneeze, can exercise OK.  Physical Exam:BP 114/79 (BP Location: Right Arm, Patient Position: Sitting, Cuff Size: Normal)   Pulse 81   Ht 5' 0.75" (1.543 m)   Wt 162 lb (73.5 kg)   BMI 30.86 kg/m   General:  Well developed, well nourished, no acute distress Skin:  Warm and dry Neck:  Midline trachea, normal thyroid, good ROM, no lymphadenopathy Lungs; Clear to auscultation bilaterally Breast:  No dominant palpable mass, retraction, or nipple discharge Cardiovascular: Regular rate and rhythm Abdomen:  Soft, non tender, no hepatosplenomegaly Pelvic:  External genitalia is normal in appearance, no lesions.  The vagina is normal in appearance. Scant brown discharge in vault. Urethra has no lesions or masses. The cervix is smooth, pap with HR HPV genotyping performed.  Uterus is felt to be normal size, shape, and contour.  No adnexal masses or tenderness noted.Bladder is non tender, no masses felt Extremities/musculoskeletal:  No swelling or varicosities noted, no clubbing or cyanosis Psych:  No mood changes, alert and cooperative,seems happy AA is 1 Fall risk is low    03/25/2022    9:20 AM 02/27/2021   11:27 AM 03/08/2020   11:38 AM   Depression screen PHQ 2/9  Decreased Interest 0 0 0  Down, Depressed, Hopeless 0 0 0  PHQ - 2 Score 0 0 0  Altered sleeping 0 1 0  Tired, decreased energy 0 0 0  Change in appetite 0 0 0  Feeling bad or failure about yourself  0 0 0  Trouble concentrating 0 0 0  Moving slowly or fidgety/restless 0 0 0  Suicidal thoughts 0 0 0  PHQ-9 Score 0 1 0       03/25/2022    9:20 AM 02/27/2021   11:28 AM 03/08/2020   11:39 AM  GAD 7 : Generalized Anxiety Score  Nervous, Anxious, on Edge 0 0 0  Control/stop worrying 0 0 0  Worry too much - different things 1 0 0  Trouble relaxing 0 0 0  Restless 0 0 0  Easily annoyed or irritable 1 0 1  Afraid - awful might happen 0 0 0  Total GAD 7 Score 2 0 1  Anxiety Difficulty  Not difficult at all Not difficult at all    Upstream - 03/25/22 0919       Pregnancy Intention Screening   Does the patient want to become pregnant in the next year? Unsure    Does the patient's partner want to become pregnant in the next year? Unsure    Would the patient like to discuss contraceptive options today? No      Contraception Wrap Up   Current Method Oral Contraceptive    End Method Oral Contraceptive    Contraception  Counseling Provided No              Examination chaperoned by Celene Squibb LPN  Impression and Plan: 1. Encounter for gynecological examination with Papanicolaou smear of cervix Pap sent Physical in 1 year Pap in 3 if normal Labs with PCP   2. Encounter for surveillance of contraceptive pills Refilled Micronor Meds ordered this encounter  Medications   norethindrone (MICRONOR) 0.35 MG tablet    Sig: Take 1 tablet (0.35 mg total) by mouth daily.    Dispense:  84 tablet    Refill:  4    **Patient requests 90 days supply**    Order Specific Question:   Supervising Provider    Answer:   Elonda Husky, LUTHER H [[id number]]     3. Primary stress urinary incontinence Increase frequency of kegels Limit caffeine

## 2022-03-26 LAB — CYTOLOGY - PAP
Adequacy: ABSENT
Comment: NEGATIVE
Diagnosis: NEGATIVE
High risk HPV: NEGATIVE

## 2023-02-03 ENCOUNTER — Telehealth: Payer: Self-pay | Admitting: Adult Health

## 2023-02-03 MED ORDER — NORETHINDRONE 0.35 MG PO TABS: 1.0000 | ORAL_TABLET | Freq: Every day | ORAL | 4 refills | Status: DC

## 2023-02-03 NOTE — Addendum Note (Signed)
Addended by: Cyril Mourning A on: 02/03/2023 02:34 PM   Modules accepted: Orders

## 2023-02-03 NOTE — Telephone Encounter (Signed)
Refilled Micronor  

## 2023-02-03 NOTE — Telephone Encounter (Signed)
Patient called scheduled appt for annual but it's not until 6-10 and she states her birth control will run out before then and wants to know if a script could be sent to [facility] in [place]

## 2023-04-05 ENCOUNTER — Ambulatory Visit (INDEPENDENT_AMBULATORY_CARE_PROVIDER_SITE_OTHER): Payer: BC Managed Care – PPO | Admitting: Adult Health

## 2023-04-05 ENCOUNTER — Encounter: Payer: Self-pay | Admitting: Adult Health

## 2023-04-05 VITALS — BP 109/67 | HR 57 | Ht 60.0 in | Wt 150.5 lb

## 2023-04-05 DIAGNOSIS — Z01419 Encounter for gynecological examination (general) (routine) without abnormal findings: Secondary | ICD-10-CM | POA: Diagnosis not present

## 2023-04-05 DIAGNOSIS — Z3041 Encounter for surveillance of contraceptive pills: Secondary | ICD-10-CM | POA: Diagnosis not present

## 2023-04-05 NOTE — Progress Notes (Signed)
Patient ID: Anna Kent, female   DOB: 11-19-1995, 27 y.o.   MRN: [id number] History of Present Illness: Dorris is a 27 year old white female,married, G0P0, in for a well woman gyn exam, she had physical with PCP 03/08/23.     Component Value Date/Time   DIAGPAP  03/25/2022 0921    - Negative for intraepithelial lesion or malignancy (NILM)   DIAGPAP  01/10/2019 0000    NEGATIVE FOR INTRAEPITHELIAL LESIONS OR MALIGNANCY.   HPVHIGH Negative 03/25/2022 0921   ADEQPAP  03/25/2022 0921    Satisfactory for evaluation; transformation zone component ABSENT.   ADEQPAP  01/10/2019 0000    Satisfactory for evaluation  endocervical/transformation zone component PRESENT.    PCP is Arty Baumgartner FNP   Current Medications, Allergies, Past Medical History, Past Surgical History, Family History and Social History were reviewed in [employer] record.     Review of Systems: Patient denies any headaches, hearing loss, fatigue, blurred vision, shortness of breath, chest pain, abdominal pain, problems with bowel movements, urination(occasional SUI), or intercourse. No joint pain or mood swings.     Physical Exam:BP 109/67 (BP Location: Left Arm, Patient Position: Sitting, Cuff Size: Normal)   Pulse (!) 57   Ht 5' (1.524 m)   Wt 150 lb 8 oz (68.3 kg)   LMP 03/16/2023 (Approximate)   BMI 29.39 kg/m   General:  Well developed, well nourished, no acute distress Skin:  Warm and dry Neck:  Midline trachea, normal thyroid, good ROM, no lymphadenopathy Lungs; Clear to auscultation bilaterally Breast:  No dominant palpable mass, retraction, or nipple discharge Cardiovascular: Regular rate and rhythm Abdomen:  Soft, non tender, no hepatosplenomegaly Pelvic:  External genitalia is normal in appearance, no lesions.  The vagina is normal in appearance. Urethra has no lesions or masses. The cervix is smooth. Uterus is felt to be normal size, shape, and contour.  No adnexal masses or  tenderness noted.Bladder is non tender, no masses felt. Extremities/musculoskeletal:  No swelling or varicosities noted, no clubbing or cyanosis Psych:  No mood changes, alert and cooperative,seems happy AA is 1 Fall risk is low    04/05/2023   11:55 AM 03/25/2022    9:20 AM 02/27/2021   11:27 AM  Depression screen PHQ 2/9  Decreased Interest 0 0 0  Down, Depressed, Hopeless 0 0 0  PHQ - 2 Score 0 0 0  Altered sleeping 1 0 1  Tired, decreased energy 0 0 0  Change in appetite 0 0 0  Feeling bad or failure about yourself  0 0 0  Trouble concentrating 0 0 0  Moving slowly or fidgety/restless 0 0 0  Suicidal thoughts 0 0 0  PHQ-9 Score 1 0 1       04/05/2023   11:55 AM 03/25/2022    9:20 AM 02/27/2021   11:28 AM 03/08/2020   11:39 AM  GAD 7 : Generalized Anxiety Score  Nervous, Anxious, on Edge 0 0 0 0  Control/stop worrying 0 0 0 0  Worry too much - different things 0 1 0 0  Trouble relaxing 0 0 0 0  Restless 0 0 0 0  Easily annoyed or irritable 1 1 0 1  Afraid - awful might happen 0 0 0 0  Total GAD 7 Score 1 2 0 1  Anxiety Difficulty   Not difficult at all Not difficult at all      Upstream - 04/05/23 0938       Contraception  Wrap Up   End Method Oral Contraceptive    Contraception Counseling Provided No    How was the end contraceptive method provided? N/A             Examination chaperoned by Malachy Mood LPN  Impression and Plan: 1. Encounter for well woman exam with routine gynecological exam Physical with PCP Pap in 2026 Labs with PCP  GYN exam in 1 year  2. Encounter for surveillance of contraceptive pills Continue Micronor, has refills

## 2023-07-14 ENCOUNTER — Ambulatory Visit: Payer: BC Managed Care – PPO | Admitting: Adult Health

## 2023-07-22 ENCOUNTER — Encounter: Payer: Self-pay | Admitting: Adult Health

## 2023-07-22 ENCOUNTER — Ambulatory Visit: Payer: BC Managed Care – PPO | Admitting: Adult Health

## 2023-07-22 VITALS — BP 117/81 | HR 60 | Ht 60.0 in | Wt 143.0 lb

## 2023-07-22 DIAGNOSIS — R102 Pelvic and perineal pain: Secondary | ICD-10-CM

## 2023-07-22 DIAGNOSIS — Z3202 Encounter for pregnancy test, result negative: Secondary | ICD-10-CM | POA: Diagnosis not present

## 2023-07-22 DIAGNOSIS — N921 Excessive and frequent menstruation with irregular cycle: Secondary | ICD-10-CM

## 2023-07-22 LAB — POCT URINE PREGNANCY: Preg Test, Ur: NEGATIVE

## 2023-07-22 NOTE — Progress Notes (Signed)
Subjective:     Patient ID: Anna Kent, female   DOB: Feb 27, 1996, 27 y.o.   MRN: [id number]  HPI Breindy is a 27 year old white female, married, G0P0, in complaining of having cramping and irregular bleeding with Micronor, starting in January. The blood is brown, and about 2 x a month,but cramping worse lately, none now. Had not had any bleeding in about 2 years.      Component Value Date/Time   DIAGPAP  03/25/2022 0921    - Negative for intraepithelial lesion or malignancy (NILM)   DIAGPAP  01/10/2019 0000    NEGATIVE FOR INTRAEPITHELIAL LESIONS OR MALIGNANCY.   HPVHIGH Negative 03/25/2022 0921   ADEQPAP  03/25/2022 0921    Satisfactory for evaluation; transformation zone component ABSENT.   ADEQPAP  01/10/2019 0000    Satisfactory for evaluation  endocervical/transformation zone component PRESENT.   PCP is Arty Baumgartner FNP  Review of Systems +cramping and irregular bleeding    Reviewed past medical,surgical, social and family history. Reviewed medications and allergies.  Objective:   Physical Exam BP 117/81 (BP Location: Right Arm, Patient Position: Sitting, Cuff Size: Normal)   Pulse 60   Ht 5' (1.524 m)   Wt 143 lb (64.9 kg)   BMI 27.93 kg/m  UPT is negative.    Skin warm and dry.  Lungs: clear to ausculation bilaterally. Cardiovascular: regular rate and rhythm.  Fall risk is low  Upstream - 07/22/23 1211       Pregnancy Intention Screening   Does the patient want to become pregnant in the next year? Ok Either Way    Does the patient's partner want to become pregnant in the next year? Ok Either Way    Would the patient like to discuss contraceptive options today? No      Contraception Wrap Up   Current Method Oral Contraceptive    End Method Oral Contraceptive    Contraception Counseling Provided Yes             Assessment:     1. Pregnancy examination or test, negative result - POCT urine pregnancy  2. Pelvic cramping +cramping on and off since  January, worse lately, non now Will get pelvic US to assess uterus and ovaries, in office in about 2 weeks  - US PELVIC COMPLETE WITH TRANSVAGINAL; Future  3. Irregular intermenstrual bleeding Having brown bleeding about 2 x a month, now, on Micronor Will get [place] to assess uterus  - US PELVIC COMPLETE WITH TRANSVAGINAL; Future    Continue Micronor  Plan:    Return in about 2 weeks for [place]

## 2023-08-05 ENCOUNTER — Ambulatory Visit (INDEPENDENT_AMBULATORY_CARE_PROVIDER_SITE_OTHER): Payer: BC Managed Care – PPO

## 2023-08-05 DIAGNOSIS — R102 Pelvic and perineal pain: Secondary | ICD-10-CM | POA: Diagnosis not present

## 2023-08-05 DIAGNOSIS — N921 Excessive and frequent menstruation with irregular cycle: Secondary | ICD-10-CM

## 2023-08-05 NOTE — Progress Notes (Signed)
PELVIC US TA/TV: homogeneous anteverted uterus,WNL,EEC 5.3 mm,normal right ovary,simple left ovarian cyst 3.6 x 2.9 x 2.8 cm,small amount of simple cul de sac fluid   Chaperone Peggy

## 2024-02-25 ENCOUNTER — Emergency Department (HOSPITAL_COMMUNITY)
Admission: EM | Admit: 2024-02-25 | Discharge: 2024-02-25 | Disposition: A | Discharge status: Home / Self Care | Attending: Emergency Medicine | Admitting: Emergency Medicine

## 2024-02-25 ENCOUNTER — Other Ambulatory Visit: Payer: Self-pay

## 2024-02-25 ENCOUNTER — Encounter (HOSPITAL_COMMUNITY): Payer: Self-pay

## 2024-02-25 DIAGNOSIS — R42 Dizziness and giddiness: Secondary | ICD-10-CM | POA: Diagnosis present

## 2024-02-25 LAB — CBC WITH DIFFERENTIAL/PLATELET
Abs Immature Granulocytes: 0.03 10*3/uL (ref 0.00–0.07)
Basophils Absolute: 0.1 10*3/uL (ref 0.0–0.1)
Basophils Relative: 1 %
Eosinophils Absolute: 0.1 10*3/uL (ref 0.0–0.5)
Eosinophils Relative: 1 %
HCT: 43.2 % (ref 36.0–46.0)
Hemoglobin: 14.2 g/dL (ref 12.0–15.0)
Immature Granulocytes: 0 %
Lymphocytes Relative: 27 %
Lymphs Abs: 3.2 10*3/uL (ref 0.7–4.0)
MCH: 28.5 pg (ref 26.0–34.0)
MCHC: 32.9 g/dL (ref 30.0–36.0)
MCV: 86.7 fL (ref 80.0–100.0)
Monocytes Absolute: 0.5 10*3/uL (ref 0.1–1.0)
Monocytes Relative: 4 %
Neutro Abs: 7.9 10*3/uL — ABNORMAL HIGH (ref 1.7–7.7)
Neutrophils Relative %: 67 %
Platelets: 475 10*3/uL — ABNORMAL HIGH (ref 150–400)
RBC: 4.98 MIL/uL (ref 3.87–5.11)
RDW: 12.8 % (ref 11.5–15.5)
WBC: 11.7 10*3/uL — ABNORMAL HIGH (ref 4.0–10.5)
nRBC: 0 % (ref 0.0–0.2)

## 2024-02-25 LAB — BASIC METABOLIC PANEL WITH GFR
Anion gap: 10 (ref 5–15)
BUN: 9 mg/dL (ref 6–20)
CO2: 23 mmol/L (ref 22–32)
Calcium: 9.5 mg/dL (ref 8.9–10.3)
Chloride: 103 mmol/L (ref 98–111)
Creatinine, Ser: 0.83 mg/dL (ref 0.44–1.00)
GFR, Estimated: >60 mL/min (ref >60)
Glucose, Bld: 81 mg/dL (ref 70–99)
Potassium: 3.5 mmol/L (ref 3.5–5.1)
Sodium: 136 mmol/L (ref 135–145)

## 2024-02-25 LAB — HCG, QUANTITATIVE, PREGNANCY: hCG, Beta Chain, Quant, S: <1 m[IU]/mL (ref <5)

## 2024-02-25 MED ORDER — ACETAMINOPHEN 500 MG PO TABS
1000.0000 mg | ORAL_TABLET | Freq: Once | ORAL | Status: AC
  Administered 2024-02-25: 1000 mg via ORAL
  Filled 2024-02-25: qty 2

## 2024-02-25 MED ORDER — MECLIZINE HCL 12.5 MG PO TABS
25.0000 mg | ORAL_TABLET | Freq: Once | ORAL | Status: AC
  Administered 2024-02-25: 25 mg via ORAL
  Filled 2024-02-25: qty 2

## 2024-02-25 NOTE — Discharge Instructions (Signed)
 Thank you for coming to [facility] Emergency Department. You were seen for vertigo. We did an exam, labs, and these showed likely peripheral vertigo or vertigo caused by a migraine. You can alternate taking Tylenol  and ibuprofen  as needed for pain. You can take 650mg  tylenol  (acetaminophen ) every 4-6 hours, and 600 mg ibuprofen  3 times a day.  Please follow up with your primary care provider within 1 week.   Do not hesitate to return to the ED or call 911 if you experience: -Worsening symptoms -Visual changes, asymmetric numbness/tingling/weakness -Severe headache -Lightheadedness, passing out -Fevers/chills -Anything else that concerns you

## 2024-02-25 NOTE — ED Provider Notes (Signed)
 [place] EMERGENCY DEPARTMENT AT [facility] Provider Note   CSN: [id number] Arrival date & time: 02/25/24  1831     History {Add pertinent medical, surgical, social history, OB history to HPI:1} Chief Complaint  Patient presents with   Dizziness    ABBIEGALE Kent is a 28 y.o. female with PMH as listed below who presents with dizziness. She began having "moving vision" and was concerned for vertigo so took 25 mg meclizine  which helped some. She has some nausea with no vomiting. 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 No f/c, flu-like sxs, abd pain, CP, SOB, lightheadedness, passing out, urinary sxs, vaginal sxs, diarrhea/constipation, leg swelling, asymmetric numbness/tingling/weakness, falls/head trauma, hearing changes, tinnitus. No recent changes to medications or new medications.   Past Medical History:  Diagnosis Date   Acne 09/06/2014   Contraceptive management 08/07/2015   History of dumping syndrome    Migraines    UTI (lower urinary tract infection)        Home Medications Prior to Admission medications   Medication Sig Start Date End Date Taking? Authorizing Provider  acyclovir (ZOVIRAX) 800 MG tablet Take by mouth. 07/01/23   [provider]  Fexofenadine HCl (ALLEGRA PO) Take by mouth.    [provider]  fluticasone (FLONASE) 50 MCG/ACT nasal spray Place into both nostrils daily.    [provider]  Multiple Vitamin (MULTI-VITAMIN DAILY PO)  0 Refill(s) 07/10/20   [provider]  norethindrone  (MICRONOR ) 0.35 MG tablet Take 1 tablet (0.35 mg total) by mouth daily. 02/03/23   Javan Messing, NP  Prenatal Vit-Fe Fumarate-FA (PRENATAL VITAMIN PO) Take by mouth.    [provider]  Probiotic Product (PROBIOTIC PO) Take by mouth.    [provider]  prochlorperazine (COMPAZINE) 5 MG tablet Take 5 mg by mouth every 6 (six) hours as  needed for nausea or vomiting.    [provider]  psyllium (METAMUCIL) 58.6 % packet Take 1 packet by mouth daily.    [provider]  UBRELVY 50 MG TABS Take by mouth. 07/01/23   [provider]      Allergies    Bentyl [dicyclomine], Imitrex [sumatriptan], and Egg white (egg protein)    Review of Systems   Review of Systems A 10 point review of systems was performed and is negative unless otherwise reported in HPI.  Physical Exam Updated Vital Signs BP 126/85 (BP Location: Right Arm)   Pulse 77   Temp 98 F (36.7 C) (Oral)   Resp 18   Ht 5' (1.524 m)   Wt 65.3 kg   SpO2 100%   BMI 28.12 kg/m  Physical Exam General: Normal appearing female, lying in bed.  HEENT: PERRLA, EOMI, no nystagmus, Sclera anicteric, MMM, trachea midline. BL TMs clear.  Cardiology: RRR, no murmurs/rubs/gallops.   Resp: Normal respiratory rate and effort. CTAB, no wheezes, rhonchi, crackles.  Abd: Soft, non-tender, non-distended. No rebound tenderness or guarding.  GU: Deferred. MSK: No peripheral edema or signs of trauma.  Skin: warm, dry.  Neuro: A&Ox4, CNs II-XII grossly intact. 5/5 strength all extremities. Sensation grossly intact. Tongue protrudes midline. Normal speech. Psych: Normal mood and affect.   ED Results / Procedures / Treatments   Labs (all labs ordered are listed, but only abnormal results are displayed) Labs Reviewed  BASIC METABOLIC PANEL WITH GFR  CBC WITH DIFFERENTIAL/PLATELET  HCG, QUANTITATIVE, PREGNANCY  EKG EKG Interpretation Date/Time:  Friday Feb 25 2024 20:40:57 EDT Ventricular Rate:  71 PR Interval:  132 QRS Duration:  94 QT Interval:  361 QTC Calculation: 393 R Axis:   62  Text Interpretation: Sinus rhythm Confirmed by Annita Kindle [phone]) on 02/25/2024 9:02:00 PM  Radiology No results found.  Procedures Procedures  {Document cardiac monitor, telemetry assessment procedure when appropriate:1}  Medications Ordered in  ED Medications  meclizine  (ANTIVERT ) tablet 25 mg (25 mg Oral Given 02/25/24 2036)    ED Course/ Medical Decision Making/ A&P                          Medical Decision Making Amount and/or Complexity of Data Reviewed Labs: ordered.    This patient presents to the ED for concern of vertigo, this involves an extensive number of treatment options, and is a complaint that carries with it a high risk of complications and morbidity.  I considered the following differential and admission for this acute, potentially life threatening condition.   MDM:    She has no nystagmus on exam, cannot complete HINTS exam. She does have very mild dizziness momentarily when performing dix-hallpike maneuver to the left. Likely BPPV or peripheral vertigo. Will get basic labs and repeat dose meclizine  and reevaluate. Lower c/f meniere's disease/labyrinthitis/vestibular neuritis. No ear FB. No indications of posterior stroke/brain tumor. Must also consider basilar or vestibular migraine given patient's history, but she has no headache and had no aura today.      Labs: I Ordered, and personally interpreted labs.  The pertinent results include:  those lsited above  Additional history obtained from chart review.   Reevaluation: After the interventions noted above, I reevaluated the patient and found that they have :improved  Social Determinants of Health: Lives independently  Disposition:  ***  Co morbidities that complicate the patient evaluation  Past Medical History:  Diagnosis Date   Acne 09/06/2014   Contraceptive management 08/07/2015   History of dumping syndrome    Migraines    UTI (lower urinary tract infection)      Medicines Meds ordered this encounter  Medications   meclizine  (ANTIVERT ) tablet 25 mg    I have reviewed the patients home medicines and have made adjustments as needed  Problem List / ED Course: Problem List Items Addressed This Visit   None        {Document  critical care time when appropriate:1} {Document review of labs and clinical decision tools ie heart score, Chads2Vasc2 etc:1}  {Document your independent review of radiology images, and any outside records:1} {Document your discussion with family members, caretakers, and with consultants:1} {Document social determinants of health affecting pt's care:1} {Document your decision making why or why not admission, treatments were needed:1}  This note was created using dictation software, which may contain spelling or grammatical errors.

## 2024-02-25 NOTE — ED Triage Notes (Signed)
 Pt stated that she began having "moving vision". Pt has nystagmus and ocular migraines but stated that this feels much different. Pt did take 25 meclazine and it did not work

## 2024-04-13 ENCOUNTER — Encounter: Payer: Self-pay | Admitting: Adult Health

## 2024-04-13 ENCOUNTER — Ambulatory Visit: Admitting: Adult Health

## 2024-04-13 VITALS — BP 114/81 | HR 112 | Ht 60.0 in | Wt 148.0 lb

## 2024-04-13 DIAGNOSIS — Z3041 Encounter for surveillance of contraceptive pills: Secondary | ICD-10-CM

## 2024-04-13 DIAGNOSIS — N921 Excessive and frequent menstruation with irregular cycle: Secondary | ICD-10-CM

## 2024-04-13 DIAGNOSIS — Z01419 Encounter for gynecological examination (general) (routine) without abnormal findings: Secondary | ICD-10-CM

## 2024-04-13 DIAGNOSIS — Z3202 Encounter for pregnancy test, result negative: Secondary | ICD-10-CM | POA: Diagnosis not present

## 2024-04-13 LAB — POCT URINE PREGNANCY: Preg Test, Ur: NEGATIVE

## 2024-04-13 MED ORDER — NORETHINDRONE 0.35 MG PO TABS: 1.0000 | ORAL_TABLET | Freq: Every day | ORAL | 4 refills | Status: AC

## 2024-04-13 NOTE — Progress Notes (Signed)
 Patient ID: Anna Kent, female   DOB: Dec 07, 1995, 28 y.o.   MRN: [id number] History of Present Illness: Valera is a 28 year old white female, married, G0P0, in for a well woman gyn exam. She is seeing cardiologist for evaluation for POTS, and sees rheumatology for ?EDS.  Having some spotting and cramping with micronor .     Component Value Date/Time   DIAGPAP  03/25/2022 0921    - Negative for intraepithelial lesion or malignancy (NILM)   DIAGPAP  01/10/2019 0000    NEGATIVE FOR INTRAEPITHELIAL LESIONS OR MALIGNANCY.   HPVHIGH Negative 03/25/2022 0921   ADEQPAP  03/25/2022 0921    Satisfactory for evaluation; transformation zone component ABSENT.   ADEQPAP  01/10/2019 0000    Satisfactory for evaluation  endocervical/transformation zone component PRESENT.    PCP is Arliss Benton FNP    Current Medications, Allergies, Past Medical History, Past Surgical History, Family History and Social History were reviewed in [employer] record.     Review of Systems: Patient denies any headaches, hearing loss, fatigue, blurred vision, shortness of breath, chest pain, abdominal pain, problems with bowel movements, urination, or intercourse. No joint pain or mood swings.  See HPI for positives    Physical Exam:BP 114/81 (BP Location: Right Arm, Patient Position: Sitting, Cuff Size: Normal)   Pulse (!) 112   Ht 5' (1.524 m)   Wt 148 lb (67.1 kg)   LMP 04/01/2024   BMI 28.90 kg/m  UPT is negative  General:  Well developed, well nourished, no acute distress Skin:  Warm and dry Neck:  Midline trachea, normal thyroid, good ROM, no lymphadenopathy Lungs; Clear to auscultation bilaterally Breast:  No dominant palpable mass, retraction, or nipple discharge Cardiovascular: Regular rate and rhythm Abdomen:  Soft, non tender, no hepatosplenomegaly Pelvic:  External genitalia is normal in appearance, no lesions.  The vagina is normal in appearance. Urethra has no lesions or  masses. The cervix is smooth.  Uterus is felt to be normal size, shape, and contour.  No adnexal masses or tenderness noted.Bladder is non tender, no masses felt. Extremities/musculoskeletal:  No swelling or varicosities noted, no clubbing or cyanosis Psych:  No mood changes, alert and cooperative,seems happy AA is 1 Fall risk is low    04/13/2024   10:39 AM 04/05/2023   11:55 AM 03/25/2022    9:20 AM  Depression screen PHQ 2/9  Decreased Interest 0 0 0  Down, Depressed, Hopeless 0 0 0  PHQ - 2 Score 0 0 0  Altered sleeping 0 1 0  Tired, decreased energy 0 0 0  Change in appetite 0 0 0  Feeling bad or failure about yourself  0 0 0  Trouble concentrating 0 0 0  Moving slowly or fidgety/restless 0 0 0  Suicidal thoughts 0 0 0  PHQ-9 Score 0 1 0       04/13/2024   10:39 AM 04/05/2023   11:55 AM 03/25/2022    9:20 AM 02/27/2021   11:28 AM  GAD 7 : Generalized Anxiety Score  Nervous, Anxious, on Edge 0 0 0 0  Control/stop worrying 0 0 0 0  Worry too much - different things 1 0 1 0  Trouble relaxing 1 0 0 0  Restless 0 0 0 0  Easily annoyed or irritable 1 1 1  0  Afraid - awful might happen 0 0 0 0  Total GAD 7 Score 3 1 2  0  Anxiety Difficulty    Not difficult  at all      Upstream - 04/13/24 1039       Pregnancy Intention Screening   Does the patient want to become pregnant in the next year? Unsure    Does the patient's partner want to become pregnant in the next year? Unsure    Would the patient like to discuss contraceptive options today? No      Contraception Wrap Up   Current Method Oral Contraceptive    End Method Oral Contraceptive    Contraception Counseling Provided No         Examination chaperoned by Lorean Rodes LPN   Impression and plan: 1. Pregnancy test negative - POCT urine pregnancy  2. Encounter for well woman exam with routine gynecological exam (Primary) Pap and physical in 1 year Labs with PCP  3. Encounter for surveillance of contraceptive  pills Refilled micronor  Meds ordered this encounter  Medications   norethindrone  (MICRONOR ) 0.35 MG tablet    Sig: Take 1 tablet (0.35 mg total) by mouth daily.    Dispense:  84 tablet    Refill:  4    **Patient requests 90 days supply**    Supervising Provider:   Randolm Butte, LUTHER H [[id number]]     4. Irregular intermenstrual bleeding Has brown spotting at times, not bothering her UPT Is negative  Continue Micronor  Will watch for now

## 2024-10-30 ENCOUNTER — Ambulatory Visit: Admitting: Adult Health

## 2024-10-30 ENCOUNTER — Encounter: Payer: Self-pay | Admitting: Adult Health

## 2024-10-30 VITALS — BP 115/77 | HR 67 | Ht 60.0 in | Wt 142.0 lb

## 2024-10-30 DIAGNOSIS — N6321 Unspecified lump in the left breast, upper outer quadrant: Secondary | ICD-10-CM | POA: Diagnosis not present

## 2024-10-30 DIAGNOSIS — Z803 Family history of malignant neoplasm of breast: Secondary | ICD-10-CM | POA: Diagnosis not present

## 2024-10-30 NOTE — Progress Notes (Signed)
" °  Subjective:     Patient ID: Anna Kent, female   DOB: 1996/06/20, 29 y.o.   MRN: [id number]  HPI Anna Kent is a 29 year old white female, married, G0P0, in complaining of left breat lump, found about a month ago, it is non tender and non mobile.     Component Value Date/Time   DIAGPAP  03/25/2022 0921    - Negative for intraepithelial lesion or malignancy (NILM)   DIAGPAP  01/10/2019 0000    NEGATIVE FOR INTRAEPITHELIAL LESIONS OR MALIGNANCY.   HPVHIGH Negative 03/25/2022 0921   ADEQPAP  03/25/2022 0921    Satisfactory for evaluation; transformation zone component ABSENT.   ADEQPAP  01/10/2019 0000    Satisfactory for evaluation  endocervical/transformation zone component PRESENT.    PCP is Clarita Sax FNP Review of Systems +left breat lump, found about a month ago, it is non tender and non mobile.   Reviewed past medical,surgical, social and family history. Reviewed medications and allergies.  Objective:   Physical Exam BP 115/77 (BP Location: Right Arm, Patient Position: Sitting, Cuff Size: Normal)   Pulse 67   Ht 5' (1.524 m)   Wt 142 lb (64.4 kg)   LMP 10/23/2024 (Approximate)   BMI 27.73 kg/m      Skin warm and dry,  Breasts:no dominate palpable mass, retraction or nipple discharge on the right, on the left, no retraction or nipple discharge, does have pea sized mass at 1 0' clock 2 FB from areola and a BB sized mass at 6 0' clock at edge of areola, non tender and non mobile.  Fall risk is low  Upstream - 10/30/24 1515       Pregnancy Intention Screening   Does the patient want to become pregnant in the next year? No    Does the patient's partner want to become pregnant in the next year? No    Would the patient like to discuss contraceptive options today? No      Contraception Wrap Up   Current Method Oral Contraceptive    End Method Oral Contraceptive    Contraception Counseling Provided Yes          Assessment:     1. Mass of upper outer quadrant of  left breast (Primary) +pea sized mass at 1 0' clock 2 FB from areola and a BB sized mass at 6 0' clock at edge of areola, non tender and non mobile. Scheduled left breast US  at [facility] 11/21/24 at 11:40 am  - US  LIMITED ULTRASOUND INCLUDING AXILLA LEFT BREAST ; Future  2. Family history of breast cancer in mother Mom has breast cancer at age 55 left breast     Plan:     Follow up prn     "

## 2024-11-21 ENCOUNTER — Ambulatory Visit (HOSPITAL_COMMUNITY)
Admission: RE | Admit: 2024-11-21 | Discharge: 2024-11-21 | Disposition: A | Discharge status: Home / Self Care | Source: Ambulatory Visit | Attending: Adult Health | Admitting: Adult Health

## 2024-11-21 ENCOUNTER — Ambulatory Visit: Payer: Self-pay | Admitting: Adult Health

## 2024-11-21 DIAGNOSIS — N6321 Unspecified lump in the left breast, upper outer quadrant: Secondary | ICD-10-CM | POA: Diagnosis present

## 2024-12-07 ENCOUNTER — Ambulatory Visit: Admitting: Allergy & Immunology
